# Patient Record
Sex: Female | Born: 1960 | Race: Black or African American | Hispanic: No | Marital: Married | Smoking: Never smoker
Health system: Southern US, Community
[De-identification: ages and names within clinical notes are randomized; demographics above are authoritative.]

## PROBLEM LIST (undated history)

## (undated) DIAGNOSIS — G43909 Migraine, unspecified, not intractable, without status migrainosus: Secondary | ICD-10-CM

## (undated) DIAGNOSIS — T7840XA Allergy, unspecified, initial encounter: Secondary | ICD-10-CM

## (undated) DIAGNOSIS — F32A Depression, unspecified: Secondary | ICD-10-CM

## (undated) DIAGNOSIS — F329 Major depressive disorder, single episode, unspecified: Secondary | ICD-10-CM

## (undated) HISTORY — PX: BREAST BIOPSY: SHX20

## (undated) HISTORY — DX: Allergy, unspecified, initial encounter: T78.40XA

## (undated) HISTORY — DX: Depression, unspecified: F32.A

## (undated) HISTORY — DX: Migraine, unspecified, not intractable, without status migrainosus: G43.909

---
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
## 1998-03-15 ENCOUNTER — Ambulatory Visit (HOSPITAL_COMMUNITY): Admission: RE | Admit: 1998-03-15 | Discharge: 1998-03-15 | Payer: Self-pay | Admitting: Obstetrics

## 2001-01-24 ENCOUNTER — Ambulatory Visit (HOSPITAL_COMMUNITY): Admission: RE | Admit: 2001-01-24 | Discharge: 2001-01-24 | Payer: Self-pay | Admitting: *Deleted

## 2005-06-07 ENCOUNTER — Other Ambulatory Visit: Admission: RE | Admit: 2005-06-07 | Discharge: 2005-06-07 | Payer: Self-pay | Admitting: Family Medicine

## 2006-11-06 HISTORY — PX: STOMACH SURGERY: SHX791

## 2007-03-15 ENCOUNTER — Ambulatory Visit (HOSPITAL_COMMUNITY): Admission: RE | Admit: 2007-03-15 | Discharge: 2007-03-15 | Payer: Self-pay | Admitting: Family Medicine

## 2007-06-09 ENCOUNTER — Inpatient Hospital Stay (HOSPITAL_COMMUNITY): Admission: AD | Admit: 2007-06-09 | Discharge: 2007-06-10 | Payer: Self-pay | Admitting: Obstetrics and Gynecology

## 2007-07-16 ENCOUNTER — Inpatient Hospital Stay (HOSPITAL_COMMUNITY): Admission: AD | Admit: 2007-07-16 | Discharge: 2007-07-18 | Payer: Self-pay | Admitting: Obstetrics and Gynecology

## 2007-07-17 ENCOUNTER — Encounter (INDEPENDENT_AMBULATORY_CARE_PROVIDER_SITE_OTHER): Payer: Self-pay | Admitting: Obstetrics and Gynecology

## 2007-11-19 ENCOUNTER — Ambulatory Visit (HOSPITAL_COMMUNITY): Admission: RE | Admit: 2007-11-19 | Discharge: 2007-11-19 | Payer: Self-pay | Admitting: Obstetrics and Gynecology

## 2007-11-27 ENCOUNTER — Ambulatory Visit (HOSPITAL_COMMUNITY): Admission: RE | Admit: 2007-11-27 | Discharge: 2007-11-27 | Payer: Self-pay | Admitting: Obstetrics and Gynecology

## 2007-12-26 ENCOUNTER — Encounter (INDEPENDENT_AMBULATORY_CARE_PROVIDER_SITE_OTHER): Payer: Self-pay | Admitting: Obstetrics and Gynecology

## 2007-12-26 ENCOUNTER — Ambulatory Visit (HOSPITAL_COMMUNITY): Admission: RE | Admit: 2007-12-26 | Discharge: 2007-12-28 | Payer: Self-pay | Admitting: Obstetrics and Gynecology

## 2008-09-18 ENCOUNTER — Ambulatory Visit (HOSPITAL_COMMUNITY): Admission: RE | Admit: 2008-09-18 | Discharge: 2008-09-18 | Payer: Self-pay | Admitting: Family Medicine

## 2008-11-06 HISTORY — PX: COLOSTOMY: SHX63

## 2009-05-04 ENCOUNTER — Encounter: Payer: Self-pay | Admitting: Internal Medicine

## 2009-05-14 ENCOUNTER — Ambulatory Visit: Payer: Self-pay | Admitting: Internal Medicine

## 2009-05-21 ENCOUNTER — Ambulatory Visit: Payer: Self-pay | Admitting: Internal Medicine

## 2009-05-21 ENCOUNTER — Encounter: Payer: Self-pay | Admitting: Internal Medicine

## 2009-05-25 ENCOUNTER — Encounter: Payer: Self-pay | Admitting: Internal Medicine

## 2009-11-09 ENCOUNTER — Ambulatory Visit (HOSPITAL_COMMUNITY): Admission: RE | Admit: 2009-11-09 | Discharge: 2009-11-09 | Payer: Self-pay | Admitting: Family Medicine

## 2010-11-27 ENCOUNTER — Encounter: Payer: Self-pay | Admitting: Family Medicine

## 2011-01-02 ENCOUNTER — Other Ambulatory Visit (HOSPITAL_COMMUNITY): Payer: Self-pay | Admitting: Family Medicine

## 2011-01-02 DIAGNOSIS — Z1231 Encounter for screening mammogram for malignant neoplasm of breast: Secondary | ICD-10-CM

## 2011-01-05 ENCOUNTER — Ambulatory Visit (HOSPITAL_COMMUNITY): Payer: BC Managed Care – PPO

## 2011-01-17 ENCOUNTER — Other Ambulatory Visit (HOSPITAL_COMMUNITY): Payer: Self-pay | Admitting: Family Medicine

## 2011-01-17 DIAGNOSIS — Z1231 Encounter for screening mammogram for malignant neoplasm of breast: Secondary | ICD-10-CM

## 2011-01-26 ENCOUNTER — Ambulatory Visit (HOSPITAL_COMMUNITY)
Admission: RE | Admit: 2011-01-26 | Discharge: 2011-01-26 | Disposition: A | Discharge status: Home / Self Care | Payer: BC Managed Care – PPO | Source: Ambulatory Visit | Attending: Family Medicine | Admitting: Family Medicine

## 2011-01-26 DIAGNOSIS — Z1231 Encounter for screening mammogram for malignant neoplasm of breast: Secondary | ICD-10-CM | POA: Insufficient documentation

## 2011-03-21 NOTE — H&P (Signed)
NAME:  Sarah Booth, Sarah Booth             ACCOUNT NO.:  [id number]   MEDICAL RECORD NO.:  [id number]         PATIENT TYPE:  WOIB   LOCATION:                                FACILITY:  [facility]   PHYSICIAN:  Malachi Pro. Ambrose Mantle, M.D. DATE OF BIRTH:  06-22-1961   DATE OF ADMISSION:  12/26/2007  DATE OF DISCHARGE:                              HISTORY & PHYSICAL   HISTORY OF PRESENT ILLNESS:  A 50 year old black married female, para 5-  0-1-5 who is admitted to the hospital for surgical removal of a large  pelvic mass.  This patient had a vaginal delivery on July 16, 2007.  When she returned for her 6 weeks checkup on August 27, 2007, there  seemed to be a large pelvic mass the size of a 12 week uterus.  I  thought it might be a sub involuted uterus.  She was advised to return  in 1 month to see if the mass had shrunk and on October 08, 2007, the  mass was the same.  She had had no sex since delivery.  She underwent an  ultrasound which showed no pelvic mass.  I reexamined her on November 05, 2007, and again found the mass.  I had her rescanned at our office.  Again, there was no pelvic mass but when she was scanned at the hospital  there was a 10.4 cm mixed echogenicity solid mass anterior to the  uterine fundus.  This was thought to most likely represent a  pedunculated fibroid, however, a mass of ovarian origin could not be  excluded.  An MRI was recommended.  The MRI showed a 9.3 cm right  ovarian mass most consistent with a dermoid.  The patient was advised  surgery.  She did not want a hysterectomy so we are going to do surgery  to remove the mass.   PAST MEDICAL HISTORY:  Previous operations none.  No serious illnesses.   REVIEW OF SYSTEMS:  No headache.  No heart problems.  No bowel or visual  problems.   SOCIAL HISTORY:  No alcohol or tobacco.  She is a Technical sales engineer.   FAMILY HISTORY:  Father died at 44 with a heart attack.  Mother died at  38 of a heart attack.  Siblings, a  52 year old female with lung problems,  a 52 year old female with diabetes, a 52 year old female died of an  aneurysm, a 52 year old female died with diabetes, a 52 year old female died  because of a murder and a 52 year old female is healthy.  There is a 95-  year-old sister with high blood pressure, a 89 year old sister with high  blood pressure and a 50 year old sister with diabetes, high blood  pressure and a kidney transplant.  There is a 57 year old sister with  high blood pressure and diabetes, a 52 year old healthy female.  She has  a 52 year old female child with Down's syndrome, a 40 year old healthy  female, a 52 year old healthy female, a 52 year old healthy female and a 62-  month-old healthy female.   MEDICATIONS:  She is on prenatal vitamins and one other medication.   ALLERGIES:  She has no  known drug allergies.   PHYSICAL EXAM:  VITAL SIGNS:  On admission blood pressure 134/80, pulse  80, weight is 150 pounds.  She has not had a period in 3 years.  HEENT:  Show no cranial abnormalities.  Extraocular movements are  intact.  Nose and pharynx are clear.  NECK:  Neck is supple without thyromegaly.  HEART:  Normal size and sounds.  No murmurs.  LUNGS:  Lungs are clear to auscultation.  ABDOMEN:  The abdomen is soft.  There is a mass palpable in the lower  abdomen approximately 7 cm above the pubis.  PELVIC:  Exam reveals the vulva and vagina to be clean.  The cervix is  clean.  The uterus is hard to feel.  It seems to be posterior to an  anterior cul-de-sac mass.  It is 8-10 cm in diameter.   ADMITTING IMPRESSION:  Pelvic mass thought to be secondary to a dermoid  cyst of the right ovary.  The patient is admitted most likely for right  salpingo-oophorectomy and thorough inspection of the other ovary.  She  has been counseled about the risks of surgery including but not limited  to a heart attack, stroke, pulmonary embolus, wound disruption,  hemorrhage with need for reoperation and/or  transfusion, fistula  formation, nerve injury, intestinal obstruction.  She understands and  agrees to proceed.      Malachi Pro. Ambrose Mantle, M.D.  Electronically Signed     TFH/MEDQ  D:  12/25/2007  T:  12/25/2007  Job:  [id number]

## 2011-03-21 NOTE — Discharge Summary (Signed)
Sarah Booth, BOSSI             ACCOUNT NO.:  [id number]   MEDICAL RECORD NO.:  [id number]          PATIENT TYPE:  INP   LOCATION:  9102                          [facility]ACILITY:  [facility]   PHYSICIAN:  Malachi Pro. Ambrose Mantle, M.D. DATE OF BIRTH:  [birth year]   DATE OF ADMISSION:  07/16/2007  DATE OF DISCHARGE:  07/18/2007                               DISCHARGE SUMMARY   A 50 year old married black female, para 4-0-1-4, gravida 68, EDC  August 01, 2007, admitted with premature rupture of membranes since  approximately 10:00 p.m. on July 15, 2007.  Blood group and type B+.  Negative antibody.  Sickle cell negative.  RPR nonreactive.  Rubella  immune.  Hepatitis B surface antigen negative.  HIV negative.  GC and  Chlamydia negative.  One-hour Glucola 118 and also 122.  Group B strep  was positive.  The patient had an ultrasound at [facility] of  [place] on Mar 15, 2007 after gestational age was 20 weeks and 1 day.  [facility] August 01, 2007.  Prenatal care was normal except on July 01, 2007, size was thought to be less than dates.  Ultrasound showed an  average of gestational age infant at 66.3 percentile and normal amniotic  fluid volume at 9.81 cm.  The patient began leaking fluid at 10:00 p.m.  at July 15, 2007 at approximately 2:00 a.m.  On July 16, 2007,  she had a gush of fluid.  She came to maternity admission and was  evaluated and found to have ruptured membranes and was admitted.  She  was observed for several hours without a labor pattern, so Pitocin was  begun.   PAST MEDICAL HISTORY:  No known allergies.  No operations.  History of  migraine headaches.   ALCOHOL, TOBACCO, DRUGS:  None.   FAMILY HISTORY:  Mother with high blood pressure, diabetes, and heart  disease.  Brother with high blood pressure and diabetes, heart disease  and COPD.  Sister with high blood pressure, lupus, and had a kidney  transplant.   OBSTETRICAL HISTORY:  The patient delivered a 5  pounds, 4 ounces female at  44 weeks with Downs syndrome in July, 1977.  In November, 1980, she had  a 6 pounds, 5 ounces female at 40 weeks without complications.  In  October, 1984, a 7 pounds, 5 ounces female with no complications.  In  June, 1991, a 7 pounds, 5 ounces female at 40 weeks without  complications.  In 1978, she had an early abortion.   PHYSICAL EXAMINATION:  On admission, her vital signs were normal.  HEART:  Normal.  LUNGS:  Normal.  Fundal height had been 32 cm at July 09, 2007.  Good reactivity of  the fetal heart rates.  There were mild decelerations with some  contractions.  By the nurse exam, the cervix was 1 cm, 50% vertex, and a  -1.   Patient was placed on Pitocin and penicillin.  At 10:52 a.m., I was  called to see the patient because of late decelerations.  After  reviewing the strip, I saw some mild late-onset variable decelerations  but no late decelerations.  There was excellent variability.  At 12:41  p.m., the cervix was 3-4 cm, 80%.  Patient requested an epidural.  After  her epidural, she made rapid progress to full dilatation.  She pushed  well and delivered spontaneously LOP over a second-degree midline  laceration by Dr. Ambrose Mantle.  A living female infant, 5 pounds, 6 ounces with  Apgars of 8 at one and 9 at five minutes.  The placenta was intact.  The  uterus was normal.  A second-degree midline laceration repair with 3-0  Vicryl.  Blood loss was about 400 cc.  Post-partum, the patient did well  and was discharged on the second post partum day.  Initial hemoglobin  12, hematocrit 34.3, white count 5800, platelet count 186,000.  RPR was  nonreactive.  Follow-up hemoglobin 10.8.   FINAL DIAGNOSES:  Intrauterine pregnancy at 37+ weeks, delivered left  occipitoposterior.  Advanced maternal age, positive Group B strep.   OPERATION:  Spontaneous delivery, LOP, repair of second-degree midline  laceration.   FINAL CONDITION:  Improved.   INSTRUCTIONS:   Include our regular discharge instructions booklet,  Percocet 5/325 30 tablets, 1 q.4-6h. as needed for pain.  Patient is  advised to return to the office in six weeks for follow-up examination.      Malachi Pro. Ambrose Mantle, M.D.  Electronically Signed     TFH/MEDQ  D:  07/18/2007  T:  07/18/2007  Job:  [id number]

## 2011-03-21 NOTE — Op Note (Signed)
Sarah Booth, KNOUFF             ACCOUNT NO.:  [id number]   MEDICAL RECORD NO.:  [id number]          PATIENT TYPE:  OIB   LOCATION:  9318                          [facility]ACILITY:  [facility]   PHYSICIAN:  Malachi Pro. Ambrose Mantle, M.D. DATE OF BIRTH:  1961/02/18   DATE OF PROCEDURE:  12/26/2007  DATE OF DISCHARGE:                               OPERATIVE REPORT   PREOPERATIVE DIAGNOSIS:  Pelvic mass, presumably a dermoid in the right  ovary, approximately 10 cm.   POSTOPERATIVE DIAGNOSIS:  A 10-cm dermoid in the right ovary, 2-cm  dermoid in the left ovary.   OPERATION:  Laparotomy, right oophorectomy, partial right salpingectomy,  left oophorocystectomy.   OPERATOR:  Malachi Pro. Ambrose Mantle, M.D.   ASSISTANT:  Zenaida Niece, M.D.   General anesthesia.   The patient was brought to the operating room, placed under satisfactory  general anesthesia.  She did vomit some in the operating room, so she  was given antacid before the intubation.  After she was intubated, she  was placed in frog-leg position.  The abdomen and urethra were prepped  with Betadine solution.  The bladder was drained with a Foley catheter  and hooked to drainage.  Exam revealed a pelvic mass a little to the  right of midline approximately 10 cm.  The patient was placed supine.  The abdomen was entered by transverse incision going in layers through  the skin, subcutaneous tissue, and fascia.  Fascia was then separated  from the rectus muscles superiorly and inferiorly.  The rectus muscle  was split in the midline.  The peritoneum was opened vertically.  I did  not explore the upper abdomen.  I did obtain pelvic washings and saved  them.  It was apparent that the patient had a large ovarian tumor.  It  was just underneath the incision.  With a little manipulation, it came  out through the incisional opening.  It took only three or four clamps  to clamp the infundibulopelvic ligament and the mesosalpinx and  mesovarium.  I removed  the distal part of the right tube, but the  proximal part was left.  All suture was 0 Vicryl.  Double ligatures were  placed across the infundibulopelvic ligament.  After I obtained complete  hemostasis, I preserved the 10-cm ovarian mass and sent it for frozen  section, since 25% of dermoids are bilateral.  I inspected the left  ovary and close to the hilum of the left ovary was a 2-cm mass that  appeared to be a dermoid.  I incised over its surface and then dissected  it free without spilling, bovied the base of the left ovarian dermoid in  the ovary and then reapproximated the ovary with interrupted figure-of-  eight sutures of 4-0 Vicryl.  Liberal irrigation confirmed complete  hemostasis.  The uterus itself was normal size.  The remainder of the  left tube and ovary appeared normal.  There was a proximal segment of  right tube left.  The uterus itself was normal, and both cul-de-sacs  were free of any disease.  The appendix was seen and was normal.  All  packs and retractors were removed after Dr. 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  At that point, the washings were  discarded.  The abdominal wall was closed with interrupted sutures of 0  Vicryl including the peritoneum and the rectus muscle, 2  running sutures of 0 Vicryl on the fascia, a running 3-0 Vicryl in the  subcutaneous tissue and staples on the skin.  The patient seemed to  tolerate the procedure well.  Blood loss was no more than 25 mL.  Sponge  and needle counts were correct, and she was returned to recovery in  satisfactory condition.      Malachi Pro. Ambrose Mantle, M.D.  Electronically Signed     TFH/MEDQ  D:  12/26/2007  T:  12/27/2007  Job:  [id number]

## 2011-03-24 NOTE — Discharge Summary (Signed)
NAMEJOCIE, MERONEY             ACCOUNT NO.:  [id number]   MEDICAL RECORD NO.:  [id number]          PATIENT TYPE:  OIB   LOCATION:  9318                          [facility]ILITY:  [facility]   PHYSICIAN:  Zenaida Niece, M.D.DATE OF BIRTH:  [birth year]   DATE OF ADMISSION:  12/26/2007  DATE OF DISCHARGE:  12/28/2007                               DISCHARGE SUMMARY   ADMISSION DIAGNOSIS:  Pelvic mass.   DISCHARGE DIAGNOSIS:  Bilateral ovarian dermoids.   PROCEDURES:  On December 26, 2007, she underwent an exploratory  laparotomy with right oophorectomy with partial right salpingectomy and  a left oophorocystectomy.   HISTORY AND PHYSICAL:  Please see chart for full history and physical.  Briefly, this is a 50 year old black female para 5-0-1-5 who had a  vaginal delivery in September 2008.  At her 6-week check up, she was  felt to have a large pelvic mass.  Initial ultrasounds were unable to  confirm this, but one ultrasound was eventually able to find a 10-cm  mixed echogenicity solid mass anterior to the uterine fundus.  An MRI  was performed which showed a 9.3-cm right ovarian mass most consistent  with a dermoid.  Physical exam significant for a benign abdomen with a  mass palpable in the lower abdomen approximately 7 cm above the pubic  symphysis, and on pelvic exam uterus was difficult to feel and she has  anterior cul-de-sac mass 8-10 cm in diameter.   HOSPITAL COURSE:  The patient was admitted and underwent a laparotomy  which revealed a large right ovarian mass.  There was also a small tumor  in the left ovary.  She underwent the above-mentioned procedure without  problems.  Postoperatively, she had no significant complications.  Preoperative hemoglobin was 13.6 and postoperative was 11.6.  On  postoperative day #2, she was thought to be stable enough for discharge  home.  Her incision was healing well with some serosanguineous  discharge.  She was discharged home at that  point.   DISCHARGE INSTRUCTIONS:  Regular diet.  No strenuous activity.  Follow  up is within the next week for staple removal.   MEDICATIONS:  Percocet #40 one to two p.o. q.4-6 h. p.r.n. pain.   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      Zenaida Niece, M.D.  Electronically Signed     TDM/MEDQ  D:  01/17/2008  T:  01/17/2008  Job:  [id number]

## 2011-07-28 LAB — CBC
HCT: 39.3
Hemoglobin: 13.6
MCHC: 34.6
MCV: 91.9
Platelets: 160
RBC: 3.6 — ABNORMAL LOW
RBC: 4.3
RDW: 12.3
WBC: 7.9

## 2011-07-28 LAB — DIFFERENTIAL
Basophils Absolute: 0
Basophils Relative: 1
Eosinophils Absolute: 0
Monocytes Relative: 9
Neutro Abs: 2.8
Neutrophils Relative %: 57

## 2011-07-28 LAB — COMPREHENSIVE METABOLIC PANEL
Albumin: 3.8
Alkaline Phosphatase: 96
BUN: 13
Chloride: 106
Creatinine, Ser: 1.07
GFR calc non Af Amer: 55 — ABNORMAL LOW
Glucose, Bld: 101 — ABNORMAL HIGH
Potassium: 3.6
Total Bilirubin: 0.9

## 2011-07-28 LAB — PREGNANCY, URINE

## 2011-08-18 LAB — CBC
HCT: 34.3 — ABNORMAL LOW
HCT: 36.1
Hemoglobin: 12
Hemoglobin: 12.3
MCHC: 34.6
MCHC: 34.9
MCV: 90.4
MCV: 90.5
Platelets: 205
RBC: 3.46 — ABNORMAL LOW
RBC: 3.84 — ABNORMAL LOW
RBC: 3.99
RDW: 14.2 — ABNORMAL HIGH
RDW: 14.4 — ABNORMAL HIGH
WBC: 5.9

## 2011-08-18 LAB — CCBB MATERNAL DONOR DRAW

## 2011-08-21 LAB — URINALYSIS, ROUTINE W REFLEX MICROSCOPIC
Bilirubin Urine: NEGATIVE
Glucose, UA: NEGATIVE
Ketones, ur: NEGATIVE
Nitrite: NEGATIVE
Protein, ur: NEGATIVE
pH: 6.5

## 2012-07-10 ENCOUNTER — Other Ambulatory Visit (HOSPITAL_COMMUNITY): Payer: Self-pay | Admitting: Obstetrics and Gynecology

## 2012-07-10 DIAGNOSIS — Z1231 Encounter for screening mammogram for malignant neoplasm of breast: Secondary | ICD-10-CM

## 2012-07-31 ENCOUNTER — Ambulatory Visit (HOSPITAL_COMMUNITY)
Admission: RE | Admit: 2012-07-31 | Discharge: 2012-07-31 | Disposition: A | Discharge status: Home / Self Care | Payer: BC Managed Care – PPO | Source: Ambulatory Visit | Attending: Obstetrics and Gynecology | Admitting: Obstetrics and Gynecology

## 2012-07-31 DIAGNOSIS — Z1231 Encounter for screening mammogram for malignant neoplasm of breast: Secondary | ICD-10-CM

## 2013-05-11 ENCOUNTER — Encounter (HOSPITAL_COMMUNITY): Payer: Self-pay | Admitting: Emergency Medicine

## 2013-05-11 ENCOUNTER — Emergency Department (HOSPITAL_COMMUNITY)
Admission: EM | Admit: 2013-05-11 | Discharge: 2013-05-12 | Disposition: A | Discharge status: Home / Self Care | Payer: BC Managed Care – PPO | Attending: Emergency Medicine | Admitting: Emergency Medicine

## 2013-05-11 DIAGNOSIS — R030 Elevated blood-pressure reading, without diagnosis of hypertension: Secondary | ICD-10-CM | POA: Insufficient documentation

## 2013-05-11 DIAGNOSIS — R112 Nausea with vomiting, unspecified: Secondary | ICD-10-CM | POA: Insufficient documentation

## 2013-05-11 DIAGNOSIS — Z79899 Other long term (current) drug therapy: Secondary | ICD-10-CM | POA: Insufficient documentation

## 2013-05-11 DIAGNOSIS — G43909 Migraine, unspecified, not intractable, without status migrainosus: Secondary | ICD-10-CM

## 2013-05-11 DIAGNOSIS — H53149 Visual discomfort, unspecified: Secondary | ICD-10-CM | POA: Insufficient documentation

## 2013-05-11 HISTORY — DX: Migraine, unspecified, not intractable, without status migrainosus: G43.909

## 2013-05-11 NOTE — ED Notes (Signed)
PT. REPORTS MIGRAINE HEADACHE WITH EMESIS ONSET 3 DAYS AGO UNRELIEVED BY PRESCRIPTION IMMITREX. ALERT AND ORIENTED . RESPIRATIONS UNLABORED . AMBULATORY.

## 2013-05-12 MED ORDER — NAPROXEN 500 MG PO TABS: 500.0000 mg | ORAL_TABLET | Freq: Two times a day (BID) | ORAL | Status: DC

## 2013-05-12 MED ORDER — METOCLOPRAMIDE HCL 5 MG/ML IJ SOLN
10.0000 mg | Freq: Once | INTRAMUSCULAR | Status: AC
  Administered 2013-05-12: 10 mg via INTRAVENOUS
  Filled 2013-05-12: qty 2

## 2013-05-12 MED ORDER — SODIUM CHLORIDE 0.9 % IV BOLUS (SEPSIS)
1000.0000 mL | Freq: Once | INTRAVENOUS | Status: AC
  Administered 2013-05-12: 1000 mL via INTRAVENOUS

## 2013-05-12 MED ORDER — DIPHENHYDRAMINE HCL 50 MG/ML IJ SOLN
25.0000 mg | Freq: Once | INTRAMUSCULAR | Status: AC
  Administered 2013-05-12: 25 mg via INTRAVENOUS
  Filled 2013-05-12: qty 1

## 2013-05-12 MED ORDER — KETOROLAC TROMETHAMINE 30 MG/ML IJ SOLN
30.0000 mg | Freq: Once | INTRAMUSCULAR | Status: AC
  Administered 2013-05-12: 30 mg via INTRAVENOUS
  Filled 2013-05-12: qty 1

## 2013-05-12 NOTE — ED Provider Notes (Signed)
History    CSN: [id number] Arrival date & time 05/11/13  2034  First MD Initiated Contact with Patient 05/12/13 0003     Chief Complaint  Patient presents with  . Migraine   (Consider location/radiation/quality/duration/timing/severity/associated sxs/prior Treatment) HPI Comments: 52 year old female with a history of headaches for the last 5 years who presents with a complaint of ongoing headache. She states this is a throbbing feeling, located in the front of the head, seems to wax and wane in intensity, has not responded very well to Imitrex is a medication that she usually uses. She has taken Imitrex today, minimal improvement, associated photophobia, occasional nausea. She has been able to eat and drink today, denies dysuria diarrheas, chest pain, shortness of breath, back pain, blurred vision, nasal congestion, sore throat. She has no sinus drainage or tenderness, no stiff neck, no fever  Patient is a 52 y.o. female presenting with migraines. The history is provided by the patient.  Migraine   Past Medical History  Diagnosis Date  . Migraine headache    History reviewed. No pertinent past surgical history. No family history on file. History  Substance Use Topics  . Smoking status: Never Smoker   . Smokeless tobacco: Not on file  . Alcohol Use: No   OB History   Grav Para Term Preterm Abortions TAB SAB Ect Mult Living                 Review of Systems  All other systems reviewed and are negative.    Allergies  Review of patient's allergies indicates no known allergies.  Home Medications   Current Outpatient Rx  Name  Route  Sig  Dispense  Refill  . ALPRAZolam (XANAX) 0.25 MG tablet   Oral   Take 0.25 mg by mouth 3 (three) times daily as needed for anxiety.         [name] Kitchen atorvastatin (LIPITOR) 10 MG tablet   Oral   Take 10 mg by mouth 3 (three) times a week. Monday, Wednesday, and Fridays         . calcium-vitamin D (OSCAL WITH D) 500-200 MG-UNIT per  tablet   Oral   Take 1 tablet by mouth daily.         . Cholecalciferol (VITAMIN D) 2000 UNITS CAPS   Oral   Take 1 capsule by mouth daily.         [name] Kitchen FLUoxetine (PROZAC) 20 MG capsule   Oral   Take 20 mg by mouth See admin instructions. Takes for 14 days only. Takes one week prior to menstrual cycle and week of.         . Multiple Vitamin (MULTIVITAMIN WITH MINERALS) TABS   Oral   Take 1 tablet by mouth daily.         . naproxen (NAPROSYN) 500 MG tablet   Oral   Take 1 tablet (500 mg total) by mouth 2 (two) times daily with a meal.   30 tablet   0    BP 162/91  Pulse 62  Temp(Src) 98.2 F (36.8 C) (Oral)  Resp 18  SpO2 98%  LMP 05/04/2013 Physical Exam  Nursing note and vitals reviewed. Constitutional: She appears well-developed and well-nourished. No distress.  HENT:  Head: Normocephalic and atraumatic.  Mouth/Throat: Oropharynx is clear and moist. No oropharyngeal exudate.  No sinus tenderness or drainage, nasal passages are clear, normal minutes, moist mucous membranes  Eyes: Conjunctivae and EOM are normal. Pupils are equal, round, and reactive to  light. Right eye exhibits no discharge. Left eye exhibits no discharge. No scleral icterus.  Neck: Normal range of motion. Neck supple. No JVD present. No thyromegaly present.  Cardiovascular: Normal rate, regular rhythm, normal heart sounds and intact distal pulses.  Exam reveals no gallop and no friction rub.   No murmur heard. Pulmonary/Chest: Effort normal and breath sounds normal. No respiratory distress. She has no wheezes. She has no rales.  Abdominal: Soft. Bowel sounds are normal. She exhibits no distension and no mass. There is no tenderness.  Musculoskeletal: Normal range of motion. She exhibits no edema and no tenderness.  Lymphadenopathy:    She has no cervical adenopathy.  Neurological: She is alert. Coordination normal.  Speech is clear, cranial nerves III through XII are intact, memory is intact,  strength is normal in all 4 extremities including grips, sensation is intact to light touch and pinprick in all 4 extremities. Coordination as tested by finger-nose-finger is normal, no limb ataxia. Normal gait, normal reflexes at the patellar tendons bilaterally  Skin: Skin is warm and dry. No rash noted. No erythema.  Psychiatric: She has a normal mood and affect. Her behavior is normal.    ED Course  Procedures (including critical care time) Labs Reviewed - No data to display No results found. 1. Migraine     MDM  The patient is well-appearing, her vital signs are significant only for mild hypertension, she has no signs or symptoms of pathologic headache, likely migrainous, medications ordered.  Headache has completely resolved after medications, patient stable for discharge.   Meds given in ED:  Medications  diphenhydrAMINE (BENADRYL) injection 25 mg (25 mg Intravenous Given 05/12/13 0028)  ketorolac (TORADOL) 30 MG/ML injection 30 mg (30 mg Intravenous Given 05/12/13 0028)  metoCLOPramide (REGLAN) injection 10 mg (10 mg Intravenous Given 05/12/13 0028)  sodium chloride 0.9 % bolus 1,000 mL (0 mLs Intravenous Stopped 05/12/13 0200)    New Prescriptions   NAPROXEN (NAPROSYN) 500 MG TABLET    Take 1 tablet (500 mg total) by mouth 2 (two) times daily with a meal.      Vida Roller, MD 05/12/13 [phone]

## 2013-07-30 ENCOUNTER — Encounter (HOSPITAL_COMMUNITY): Payer: Self-pay | Admitting: *Deleted

## 2013-07-30 ENCOUNTER — Emergency Department (HOSPITAL_COMMUNITY): Payer: BC Managed Care – PPO

## 2013-07-30 ENCOUNTER — Emergency Department (HOSPITAL_COMMUNITY)
Admission: EM | Admit: 2013-07-30 | Discharge: 2013-07-30 | Disposition: A | Discharge status: Home / Self Care | Payer: BC Managed Care – PPO | Attending: Emergency Medicine | Admitting: Emergency Medicine

## 2013-07-30 DIAGNOSIS — H81399 Other peripheral vertigo, unspecified ear: Secondary | ICD-10-CM

## 2013-07-30 DIAGNOSIS — R110 Nausea: Secondary | ICD-10-CM | POA: Insufficient documentation

## 2013-07-30 DIAGNOSIS — R55 Syncope and collapse: Secondary | ICD-10-CM | POA: Insufficient documentation

## 2013-07-30 DIAGNOSIS — R42 Dizziness and giddiness: Secondary | ICD-10-CM | POA: Insufficient documentation

## 2013-07-30 DIAGNOSIS — Z79899 Other long term (current) drug therapy: Secondary | ICD-10-CM | POA: Insufficient documentation

## 2013-07-30 DIAGNOSIS — Z8679 Personal history of other diseases of the circulatory system: Secondary | ICD-10-CM | POA: Insufficient documentation

## 2013-07-30 LAB — COMPREHENSIVE METABOLIC PANEL
ALT: 29 U/L (ref 0–35)
AST: 28 U/L (ref 0–37)
Albumin: 4.1 g/dL (ref 3.5–5.2)
BUN: 9 mg/dL (ref 6–23)
CO2: 24 mEq/L (ref 19–32)
Calcium: 9.3 mg/dL (ref 8.4–10.5)
Chloride: 101 mEq/L (ref 96–112)
Creatinine, Ser: 1.09 mg/dL (ref 0.50–1.10)
GFR calc non Af Amer: 56 mL/min — ABNORMAL LOW (ref >90)
Potassium: 3.8 mEq/L (ref 3.5–5.1)
Sodium: 138 mEq/L (ref 135–145)
Total Bilirubin: 0.8 mg/dL (ref 0.3–1.2)
Total Protein: 7.8 g/dL (ref 6.0–8.3)

## 2013-07-30 LAB — CBC WITH DIFFERENTIAL/PLATELET
Basophils Absolute: 0 10*3/uL (ref 0.0–0.1)
Basophils Relative: 0 % (ref 0–1)
Eosinophils Relative: 0 % (ref 0–5)
HCT: 39.5 % (ref 36.0–46.0)
Hemoglobin: 13.4 g/dL (ref 12.0–15.0)
MCHC: 33.9 g/dL (ref 30.0–36.0)
MCV: 87.2 fL (ref 78.0–100.0)
Monocytes Absolute: 0.4 10*3/uL (ref 0.1–1.0)
Monocytes Relative: 13 % — ABNORMAL HIGH (ref 3–12)
Neutro Abs: 1.9 10*3/uL (ref 1.7–7.7)
RBC: 4.53 MIL/uL (ref 3.87–5.11)
RDW: 13.6 % (ref 11.5–15.5)

## 2013-07-30 LAB — URINALYSIS, ROUTINE W REFLEX MICROSCOPIC
Bilirubin Urine: NEGATIVE
Glucose, UA: NEGATIVE mg/dL
Ketones, ur: 15 mg/dL — AB
Nitrite: NEGATIVE
Urobilinogen, UA: 0.2 mg/dL (ref 0.0–1.0)
pH: 7 (ref 5.0–8.0)

## 2013-07-30 LAB — PROTIME-INR
INR: 0.92 (ref 0.00–1.49)
Prothrombin Time: 12.2 seconds (ref 11.6–15.2)

## 2013-07-30 MED ORDER — MECLIZINE HCL 25 MG PO TABS
25.0000 mg | ORAL_TABLET | Freq: Once | ORAL | Status: AC
  Administered 2013-07-30: 25 mg via ORAL
  Filled 2013-07-30: qty 1

## 2013-07-30 MED ORDER — MECLIZINE HCL 25 MG PO TABS: 25.0000 mg | ORAL_TABLET | Freq: Three times a day (TID) | ORAL | Status: DC | PRN

## 2013-07-30 MED ORDER — SODIUM CHLORIDE 0.9 % IV BOLUS (SEPSIS)
1000.0000 mL | Freq: Once | INTRAVENOUS | Status: AC
  Administered 2013-07-30: 1000 mL via INTRAVENOUS

## 2013-07-30 MED ORDER — ONDANSETRON HCL 4 MG/2ML IJ SOLN
4.0000 mg | Freq: Once | INTRAMUSCULAR | Status: AC
  Administered 2013-07-30: 4 mg via INTRAVENOUS
  Filled 2013-07-30: qty 2

## 2013-07-30 MED ORDER — ONDANSETRON 4 MG PO TBDP: ORAL_TABLET | ORAL | Status: DC

## 2013-07-30 NOTE — ED Provider Notes (Signed)
CSN: [id number]     Arrival date & time 07/30/13  0702 History   First MD Initiated Contact with Patient 07/30/13 [phone]     Chief Complaint  Patient presents with  . Loss of Consciousness  . Dizziness   (Consider location/radiation/quality/duration/timing/severity/associated sxs/prior Treatment) Patient is a 52 y.o. female presenting with syncope.  Loss of Consciousness Associated symptoms: dizziness and nausea   Associated symptoms: no chest pain, no fever, no headaches, no shortness of breath, no vomiting and no weakness    Patient in her normal state of health yesterday states she woke up this morning at 5 AM. And when she got out of bed she has sensation of the room spinning, nausea and difficulty walking. Patient states she felt lack of coordination. She lowered herself to the floor. She denies any head or neck trauma. She believes she may have lost consciousness briefly but is unsure. She scooted herself to her daughter's bedroom and then called EMS. Patient still complains of room spinning sensation is worse when she sits up or turns her head. She's never had these sensations before. She denies any focal weakness or numbness. She denies vision changes. Denies recent URI with viral infections. Denies any tinnitus or ear pain. Patient says she does have a history of stroke and cardiac disease in her family. Past Medical History  Diagnosis Date  . Migraine headache    History reviewed. No pertinent past surgical history. No family history on file. History  Substance Use Topics  . Smoking status: Never Smoker   . Smokeless tobacco: Not on file  . Alcohol Use: No   OB History   Grav Para Term Preterm Abortions TAB SAB Ect Mult Living                 Review of Systems  Constitutional: Negative for fever and chills.  HENT: Negative for hearing loss, ear pain, congestion, rhinorrhea, neck pain, neck stiffness and tinnitus.   Eyes: Negative for visual disturbance.  Respiratory:  Negative for chest tightness, shortness of breath and wheezing.   Cardiovascular: Positive for syncope. Negative for chest pain.  Gastrointestinal: Positive for nausea. Negative for vomiting, abdominal pain and diarrhea.  Genitourinary: Negative for dysuria.  Musculoskeletal: Negative for myalgias and back pain.  Skin: Negative for rash and wound.  Neurological: Positive for dizziness and syncope. Negative for weakness, light-headedness, numbness and headaches.    Allergies  Review of patient's allergies indicates no known allergies.  Home Medications   Current Outpatient Rx  Name  Route  Sig  Dispense  Refill  . ALPRAZolam (XANAX) 0.25 MG tablet   Oral   Take 0.25 mg by mouth 3 (three) times daily as needed for anxiety.         [name] Kitchen atorvastatin (LIPITOR) 10 MG tablet   Oral   Take 10 mg by mouth 3 (three) times a week. Monday, Wednesday, and Fridays         . calcium-vitamin D (OSCAL WITH D) 500-200 MG-UNIT per tablet   Oral   Take 1 tablet by mouth daily.         . Cholecalciferol (VITAMIN D) 2000 UNITS CAPS   Oral   Take 1 capsule by mouth daily.         [name] Kitchen FLUoxetine (PROZAC) 20 MG capsule   Oral   Take 20 mg by mouth daily.          . Multiple Vitamin (MULTIVITAMIN WITH MINERALS) TABS   Oral   Take  1 tablet by mouth daily.         . naproxen (NAPROSYN) 500 MG tablet   Oral   Take 1 tablet (500 mg total) by mouth 2 (two) times daily with a meal.   30 tablet   0   . thiamine (VITAMIN B-1) 100 MG tablet   Oral   Take 100 mg by mouth daily.         . meclizine (ANTIVERT) 25 MG tablet   Oral   Take 1 tablet (25 mg total) by mouth 3 (three) times daily as needed for dizziness.   30 tablet   0   . ondansetron (ZOFRAN ODT) 4 MG disintegrating tablet      4mg  ODT q4 hours prn nausea/vomit   8 tablet   0    BP 144/63  Pulse 70  Temp(Src) 98.1 F (36.7 C) (Oral)  Resp 20  SpO2 100% Physical Exam  Nursing note and vitals  reviewed. Constitutional: She is oriented to person, place, and time. She appears well-developed and well-nourished. No distress.  HENT:  Head: Normocephalic and atraumatic.  Right Ear: External ear normal.  Left Ear: External ear normal.  Mouth/Throat: Oropharynx is clear and moist.  Eyes: EOM are normal. Pupils are equal, round, and reactive to light.  Fatigable horizontal nystagmus.  Neck: Normal range of motion. Neck supple.   No bruits appreciated. No midline cervical tenderness.  Cardiovascular: Normal rate and regular rhythm.   Pulmonary/Chest: Effort normal and breath sounds normal. No respiratory distress. She has no wheezes. She has no rales.  Abdominal: Soft. Bowel sounds are normal. She exhibits no distension and no mass. There is no tenderness. There is no rebound and no guarding.  Musculoskeletal: Normal range of motion. She exhibits no edema and no tenderness.  Neurological: She is alert and oriented to person, place, and time.  Patient is alert and oriented x3 with clear, goal oriented speech. Patient has 5/5 motor in all extremities. Sensation is intact to light touch. Bilateral finger-to-nose is normal with no signs of dysmetria. Patient is unable to stand due to her dizziness.   Skin: Skin is warm and dry. No rash noted. No erythema.  Psychiatric: She has a normal mood and affect. Her behavior is normal.    ED Course  Procedures (including critical care time) Labs Review Labs Reviewed  CBC WITH DIFFERENTIAL - Abnormal; Notable for the following:    WBC 2.9 (*)    Lymphs Abs 0.6 (*)    Monocytes Relative 13 (*)    All other components within normal limits  COMPREHENSIVE METABOLIC PANEL - Abnormal; Notable for the following:    Glucose, Bld 110 (*)    GFR calc non Af Amer 56 (*)    GFR calc Af Amer 65 (*)    All other components within normal limits  URINALYSIS, ROUTINE W REFLEX MICROSCOPIC - Abnormal; Notable for the following:    APPearance CLOUDY (*)     Ketones, ur 15 (*)    Leukocytes, UA TRACE (*)    All other components within normal limits  URINE MICROSCOPIC-ADD ON - Abnormal; Notable for the following:    Squamous Epithelial / LPF MANY (*)    All other components within normal limits  PROTIME-INR  TROPONIN I  APTT   Imaging Review Mr Brain Wo Contrast  07/30/2013   *RADIOLOGY REPORT*  Clinical Data: Vertigo with lack of coordination.  Dizziness.  MRI HEAD WITHOUT CONTRAST  Technique:  Multiplanar, multiecho pulse sequences  of the brain and surrounding structures were obtained according to standard protocol without intravenous contrast.  Comparison: No prior cross-sectional imaging studies of the brain.  Findings: There is no evidence for acute infarction, intracranial hemorrhage, mass lesion, hydrocephalus, or extra-axial fluid. There is minimal nonspecific subcortical and periventricular white matter signal abnormality, likely early chronic microvascular ischemic change.  No large vessel infarct.  Flow voids are maintained in the major intracranial vascular structures.  The pituitary gland is flattened and the sella is mildly expanded consistent with empty sella syndrome.  This can be associated variety of nonspecific complaints.  There is no cerebellar tonsillar herniation.  Upper cervical region unremarkable.  No osseous findings.  Normal orbits.  No sinus or mastoid disease. Tiny intraparotid lymph node on the right appear  IMPRESSION: No acute intracranial findings.  Mild nonspecific white matter signal abnormality, likely early chronic microvascular ischemic change.  Empty sella; significance uncertain.   Original Report Authenticated By: Davonna Belling, M.D.    Date: 07/30/2013  Rate: 85  Rhythm: normal sinus rhythm  QRS Axis: normal  Intervals: normal  ST/T Wave abnormalities: normal  Conduction Disutrbances:none  Narrative Interpretation:   Old EKG Reviewed: none available   MDM  Sensation is feeling much better. She does not  have any symptoms while lying in bed. States she does have some mild dizziness when she tries to get up. MRI with no acute findings.  Will ambulate in the emergency department and discharged home with prescription for meclizine. Patient has also been advised to followup with ENT for persistent symptoms. Return precautions have been given  Patient ambulated to the bathroom without assistance. She did become slightly more dizzy and need a wheelchair. A repeat dose of meclizine and have reiterated need to followup with ENT should symptoms persist  Loren Racer, MD 07/30/13 1123

## 2013-07-30 NOTE — ED Notes (Addendum)
PT ambulated to BR tol Well. Pt unable to ambulate back to room due to vertigo. Pt denies any vision changes.Pt returned to room vis W/C. Pt remaind A/O x4. Event reported to [facility].

## 2013-07-30 NOTE — ED Notes (Signed)
Pt got up dizzy and tried to get to bathroom and she passed out.  Pt states she woke and scooted to daughter's room.  Pt has family history of DM.  CBG 145.  BP 162/93.  No pain anywhere.

## 2013-08-26 ENCOUNTER — Other Ambulatory Visit (HOSPITAL_COMMUNITY): Payer: Self-pay | Admitting: Obstetrics and Gynecology

## 2013-08-26 DIAGNOSIS — Z1231 Encounter for screening mammogram for malignant neoplasm of breast: Secondary | ICD-10-CM

## 2013-09-11 ENCOUNTER — Ambulatory Visit (HOSPITAL_COMMUNITY): Payer: BC Managed Care – PPO

## 2013-09-25 ENCOUNTER — Other Ambulatory Visit (HOSPITAL_COMMUNITY): Payer: Self-pay | Admitting: Obstetrics and Gynecology

## 2013-12-10 ENCOUNTER — Other Ambulatory Visit: Payer: Self-pay | Admitting: Obstetrics and Gynecology

## 2013-12-10 DIAGNOSIS — R928 Other abnormal and inconclusive findings on diagnostic imaging of breast: Secondary | ICD-10-CM

## 2013-12-22 ENCOUNTER — Inpatient Hospital Stay: Admission: RE | Admit: 2013-12-22 | Payer: BC Managed Care – PPO | Source: Ambulatory Visit

## 2013-12-22 ENCOUNTER — Other Ambulatory Visit: Payer: BC Managed Care – PPO

## 2013-12-24 ENCOUNTER — Ambulatory Visit
Admission: RE | Admit: 2013-12-24 | Discharge: 2013-12-24 | Disposition: A | Discharge status: Home / Self Care | Payer: BC Managed Care – PPO | Source: Ambulatory Visit | Attending: Obstetrics and Gynecology | Admitting: Obstetrics and Gynecology

## 2013-12-24 DIAGNOSIS — R928 Other abnormal and inconclusive findings on diagnostic imaging of breast: Secondary | ICD-10-CM

## 2013-12-25 ENCOUNTER — Other Ambulatory Visit: Payer: BC Managed Care – PPO

## 2014-05-27 ENCOUNTER — Other Ambulatory Visit: Payer: Self-pay | Admitting: Obstetrics and Gynecology

## 2014-05-27 DIAGNOSIS — N6310 Unspecified lump in the right breast, unspecified quadrant: Secondary | ICD-10-CM

## 2014-06-24 ENCOUNTER — Ambulatory Visit
Admission: RE | Admit: 2014-06-24 | Discharge: 2014-06-24 | Disposition: A | Discharge status: Home / Self Care | Payer: BC Managed Care – PPO | Source: Ambulatory Visit | Attending: Obstetrics and Gynecology | Admitting: Obstetrics and Gynecology

## 2014-06-24 ENCOUNTER — Encounter (INDEPENDENT_AMBULATORY_CARE_PROVIDER_SITE_OTHER): Payer: Self-pay

## 2014-06-24 DIAGNOSIS — N6310 Unspecified lump in the right breast, unspecified quadrant: Secondary | ICD-10-CM

## 2014-07-26 ENCOUNTER — Ambulatory Visit (INDEPENDENT_AMBULATORY_CARE_PROVIDER_SITE_OTHER): Payer: BC Managed Care – PPO

## 2014-07-26 ENCOUNTER — Ambulatory Visit (INDEPENDENT_AMBULATORY_CARE_PROVIDER_SITE_OTHER): Payer: BC Managed Care – PPO | Admitting: Family Medicine

## 2014-07-26 VITALS — BP 124/76 | HR 74 | Temp 98.0°F | Resp 20 | Ht 64.25 in | Wt 164.8 lb

## 2014-07-26 DIAGNOSIS — M25511 Pain in right shoulder: Secondary | ICD-10-CM

## 2014-07-26 DIAGNOSIS — S43429A Sprain of unspecified rotator cuff capsule, initial encounter: Secondary | ICD-10-CM

## 2014-07-26 DIAGNOSIS — M25519 Pain in unspecified shoulder: Secondary | ICD-10-CM

## 2014-07-26 DIAGNOSIS — S43421A Sprain of right rotator cuff capsule, initial encounter: Secondary | ICD-10-CM

## 2014-07-26 MED ORDER — PREDNISONE 20 MG PO TABS: 40.0000 mg | ORAL_TABLET | Freq: Every day | ORAL | Status: DC

## 2014-07-26 NOTE — Progress Notes (Signed)
Patient ID: Sarah Booth MRN: [id number], DOB: Apr 28, 1961, 53 y.o. Date of Encounter: 07/26/2014, 9:51 AM  This chart was scribed for Dr. Robyn Haber, MD by Erling Conte, Medical Scribe. This patient was seen in Room 11 and the patient's care was started at 9:54 AM.  Primary Physician: Chesley Noon, MD  Chief Complaint: Chief Complaint  Patient presents with   Arm Pain    right arm--was pulling dresser drawer last night--pain from shoulder to wrist--right hand feels a little numbness feeling     HPI: 53 y.o. year old female with history below presents with right arm pain for 1 day. Pt states that last night she went to open her dresser drawer and her right arm just went numb and she had pain radiating from her right shoulder to her wrist. She is able to flex her right arm but it hurts to lift it upward. She states that rest seems to help the pain somewhat.  She denies any injury to the area. She denies any neck or back pain.  Patient works as a Physiological scientist   Past Medical History  Diagnosis Date   Migraine headache      Home Meds: Prior to Admission medications   Medication Sig Start Date End Date Taking? Authorizing Provider  calcium-vitamin D (OSCAL WITH D) 500-200 MG-UNIT per tablet Take 1 tablet by mouth daily.   Yes Historical Provider, MD  Cholecalciferol (VITAMIN D) 2000 UNITS CAPS Take 1 capsule by mouth daily.   Yes Historical Provider, MD  FLUoxetine (PROZAC) 20 MG capsule Take 20 mg by mouth daily.    Yes Historical Provider, MD  Multiple Vitamin (MULTIVITAMIN WITH MINERALS) TABS Take 1 tablet by mouth daily.   Yes Historical Provider, MD  naproxen (NAPROSYN) 500 MG tablet Take 1 tablet (500 mg total) by mouth 2 (two) times daily with a meal. 05/12/13  Yes Johnna Acosta, MD  ALPRAZolam Duanne Moron) 0.25 MG tablet Take 0.25 mg by mouth 3 (three) times daily as needed for anxiety.    Historical Provider, MD  atorvastatin (LIPITOR) 10 MG tablet Take  10 mg by mouth 3 (three) times a week. Monday, Wednesday, and Fridays    Historical Provider, MD  meclizine (ANTIVERT) 25 MG tablet Take 1 tablet (25 mg total) by mouth 3 (three) times daily as needed for dizziness. 07/30/13   Julianne Rice, MD  ondansetron (ZOFRAN ODT) 4 MG disintegrating tablet 4mg  ODT q4 hours prn nausea/vomit 07/30/13   Julianne Rice, MD  thiamine (VITAMIN B-1) 100 MG tablet Take 100 mg by mouth daily.    Historical Provider, MD    Allergies: No Known Allergies  History   Social History   Marital Status: Married    Spouse Name: N/A    Number of Children: N/A   Years of Education: N/A   Occupational History   Not on file.   Social History Main Topics   Smoking status: Never Smoker    Smokeless tobacco: Not on file   Alcohol Use: No   Drug Use: No   Sexual Activity: Not on file   Other Topics Concern   Not on file   Social History Narrative   No narrative on file     Review of Systems: Constitutional: negative for chills, fever, night sweats, weight changes, or fatigue  HEENT: negative for vision changes, hearing loss, congestion, rhinorrhea, ST, epistaxis, or sinus pressure Cardiovascular: negative for chest pain or palpitations Respiratory: negative for hemoptysis, wheezing, shortness of breath, or  cough Abdominal: negative for abdominal pain, nausea, vomiting, diarrhea, or constipation Dermatological: negative for rash Neurologic: negative for headache, dizziness, or syncope Musculoskeletal: negative for neck pain, back pain. Positive for right arm pain accompanied by numbness that radiates down to right wrist. All other systems reviewed and are otherwise negative with the exception to those above and in the HPI.   Physical Exam: Blood pressure 124/76, pulse 74, temperature 98 F (36.7 C), temperature source Oral, resp. rate 20, height 5' 4.25" (1.632 m), weight 164 lb 12.8 oz (74.753 kg), last menstrual period 07/10/2014, SpO2 99.00%.,  Body mass index is 28.07 kg/(m^2). General: Well developed, well nourished, in no acute distress. Head: Normocephalic, atraumatic, eyes without discharge, sclera non-icteric, nares are without discharge. Bilateral auditory canals clear, TM's are without perforation, pearly grey and translucent with reflective cone of light bilaterally. Oral cavity moist, posterior pharynx without exudate, erythema, peritonsillar abscess, or post nasal drip.  Neck: Supple. No thyromegaly. Full ROM. No lymphadenopathy. Lungs: Clear bilaterally to auscultation without wheezes, rales, or rhonchi. Breathing is unlabored. Heart: RRR with S1 S2. No murmurs, rubs, or gallops appreciated. Abdomen: Soft, non-tender, non-distended with normoactive bowel sounds. No hepatomegaly. No rebound/guarding. No obvious abdominal masses. Msk:  Strength and tone normal for age. Extremities/Skin: Warm and dry. No clubbing or cyanosis. No edema. No rashes or suspicious lesions. Patient has pain when abducting the arm greater than 20 or raising arm forward greater than 30. She is diffusely tender both anteriorly as well as just below the acromion. Neuro: Alert and oriented X 3. Moves all extremities spontaneously. Gait is normal. CNII-XII grossly in tact. Psych:  Responds to questions appropriately with a normal affect.   [facility] reading (PRIMARY) by  Dr. Joseph Art:  Right shoulder shows calcification c/w rotator cuff tendonosis.   ASSESSMENT AND PLAN:  53 y.o. year old female with  Right shoulder pain - Plan: DG Shoulder Right, predniSONE (DELTASONE) 20 MG tablet, Ambulatory referral to Orthopedic Surgery  Rotator cuff (capsule) sprain, right, initial encounter - Plan: predniSONE (DELTASONE) 20 MG tablet, Ambulatory referral to Orthopedic Surgery  Signed, Robyn Haber, MD     Signed, Robyn Haber, MD 07/26/2014 9:51 AM

## 2014-07-26 NOTE — Patient Instructions (Signed)
Rotator Cuff Injury Rotator cuff injury is any type of injury to the set of muscles and tendons that make up the stabilizing unit of your shoulder. This unit holds the ball of your upper arm bone (humerus) in the socket of your shoulder blade (scapula).  CAUSES Injuries to your rotator cuff most commonly come from sports or activities that cause your arm to be moved repeatedly over your head. Examples of this include throwing, weight lifting, swimming, or racquet sports. Long lasting (chronic) irritation of your rotator cuff can cause soreness and swelling (inflammation), bursitis, and eventual damage to your tendons, such as a tear (rupture). SIGNS AND SYMPTOMS Acute rotator cuff tear:  Sudden tearing sensation followed by severe pain shooting from your upper shoulder down your arm toward your elbow.  Decreased range of motion of your shoulder because of pain and muscle spasm.  Severe pain.  Inability to raise your arm out to the side because of pain and loss of muscle power (large tears). Chronic rotator cuff tear:  Pain that usually is worse at night and may interfere with sleep.  Gradual weakness and decreased shoulder motion as the pain worsens.  Decreased range of motion. Rotator cuff tendinitis:  Deep ache in your shoulder and the outside upper arm over your shoulder.  Pain that comes on gradually and becomes worse when lifting your arm to the side or turning it inward. DIAGNOSIS Rotator cuff injury is diagnosed through a medical history, physical exam, and imaging exam. The medical history helps determine the type of rotator cuff injury. Your health care provider will look at your injured shoulder, feel the injured area, and ask you to move your shoulder in different positions. X-ray exams typically are done to rule out other causes of shoulder pain, such as fractures. MRI is the exam of choice for the most severe shoulder injuries because the images show muscles and tendons.    TREATMENT  Chronic tear:  Medicine for pain, such as acetaminophen or ibuprofen.  Physical therapy and range-of-motion exercises may be helpful in maintaining shoulder function and strength.  Steroid injections into your shoulder joint.  Surgical repair of the rotator cuff if the injury does not heal with noninvasive treatment. Acute tear:  Anti-inflammatory medicines such as ibuprofen and naproxen to help reduce pain and swelling.  A sling to help support your arm and rest your rotator cuff muscles. Long-term use of a sling is not advised. It may cause significant stiffening of the shoulder joint.  Surgery may be considered within a few weeks, especially in younger, active people, to return the shoulder to full function.  Indications for surgical treatment include the following:  Age younger than 60 years.  Rotator cuff tears that are complete.  Physical therapy, rest, and anti-inflammatory medicines have been used for 6-8 weeks, with no improvement.  Employment or sporting activity that requires constant shoulder use. Tendinitis:  Anti-inflammatory medicines such as ibuprofen and naproxen to help reduce pain and swelling.  A sling to help support your arm and rest your rotator cuff muscles. Long-term use of a sling is not advised. It may cause significant stiffening of the shoulder joint.  Severe tendinitis may require:  Steroid injections into your shoulder joint.  Physical therapy.  Surgery. HOME CARE INSTRUCTIONS   Apply ice to your injury:  Put ice in a plastic bag.  Place a towel between your skin and the bag.  Leave the ice on for 20 minutes, 2-3 times a day.  If you   have a shoulder immobilizer (sling and straps), wear it until told otherwise by your health care provider.  You may want to sleep on several pillows or in a recliner at night to lessen swelling and pain.  Only take over-the-counter or prescription medicines for pain, discomfort, or fever as  directed by your health care provider.  Do simple hand squeezing exercises with a soft rubber ball to decrease hand swelling. SEEK MEDICAL CARE IF:   Your shoulder pain increases, or new pain or numbness develops in your arm, hand, or fingers.  Your hand or fingers are colder than your other hand. SEEK IMMEDIATE MEDICAL CARE IF:   Your arm, hand, or fingers are numb or tingling.  Your arm, hand, or fingers are increasingly swollen and painful, or they turn white or blue. MAKE SURE YOU:  Understand these instructions.  Will watch your condition.  Will get help right away if you are not doing well or get worse. Document Released: 10/20/2000 Document Revised: 10/28/2013 Document Reviewed: 06/04/2013 Exit[employer] Patient Information 2015 [employer]. This information is not intended to replace advice given to you by your health care provider. Make sure you discuss any questions you have with your health care provider.  

## 2014-07-27 ENCOUNTER — Telehealth: Payer: Self-pay | Admitting: Family Medicine

## 2014-07-27 NOTE — Telephone Encounter (Signed)
Erroneous encounter

## 2014-07-28 ENCOUNTER — Telehealth: Payer: Self-pay

## 2014-07-28 NOTE — Telephone Encounter (Signed)
Patient needs a copy of x-rays to take to her orthopaedic appointment. Please call when ready for pick up.  [phone]

## 2014-07-28 NOTE — Telephone Encounter (Signed)
Given to xray

## 2015-05-23 ENCOUNTER — Ambulatory Visit (INDEPENDENT_AMBULATORY_CARE_PROVIDER_SITE_OTHER): Payer: BLUE CROSS/BLUE SHIELD | Admitting: Family Medicine

## 2015-05-23 VITALS — BP 130/80 | HR 74 | Temp 98.0°F | Resp 18 | Ht 64.0 in | Wt 174.0 lb

## 2015-05-23 DIAGNOSIS — M65312 Trigger thumb, left thumb: Secondary | ICD-10-CM | POA: Diagnosis not present

## 2015-05-23 NOTE — Patient Instructions (Signed)
Trigger Finger Trigger finger (digital tendinitis and stenosing tenosynovitis) is a common disorder that causes an often painful catching of the fingers or thumb. It occurs as a clicking, snapping, or locking of a finger in the palm of the hand. This is caused by a problem with the tendons that flex or bend the fingers sliding smoothly through their sheaths. The condition may occur in any finger or a couple fingers at the same time.  The finger may lock with the finger curled or suddenly straighten out with a snap. This is more common in patients with rheumatoid arthritis and diabetes. Left untreated, the condition may get worse to the point where the finger becomes locked in flexion, like making a fist, or less commonly locked with the finger straightened out. CAUSES   Inflammation and scarring that lead to swelling around the tendon sheath.  Repeated or forceful movements.  Rheumatoid arthritis, an autoimmune disease that affects joints.  Gout.  Diabetes mellitus. SIGNS AND SYMPTOMS  Soreness and swelling of your finger.  A painful clicking or snapping as you bend and straighten your finger. DIAGNOSIS  Your health care provider will do a physical exam of your finger to diagnose trigger finger. TREATMENT   Splinting for 6-8 weeks may be helpful.  Nonsteroidal anti-inflammatory medicines (NSAIDs) can help to relieve the pain and inflammation.  Cortisone injections, along with splinting, may speed up recovery. Several injections may be required. Cortisone may give relief after one injection.  Surgery is another treatment that may be used if conservative treatments do not work. Surgery can be minor, without incisions (a cut does not have to be made), and can be done with a needle through the skin.  Other surgical choices involve an open procedure in which the surgeon opens the hand through a small incision and cuts the pulley so the tendon can again slide smoothly. Your hand will still  work fine. HOME CARE INSTRUCTIONS  Apply ice to the injured area, twice per day:  Put ice in a plastic bag.  Place a towel between your skin and the bag.  Leave the ice on for 20 minutes, 3-4 times a day.  Rest your hand often. MAKE SURE YOU:   Understand these instructions.  Will watch your condition.  Will get help right away if you are not doing well or get worse. Document Released: 08/12/2004 Document Revised: 06/25/2013 Document Reviewed: 03/25/2013 Exit[employer] Patient Information 2015 [employer]. This information is not intended to replace advice given to you by your health care provider. Make sure you discuss any questions you have with your health care provider.  

## 2015-05-23 NOTE — Progress Notes (Signed)
Patient ID: Sarah Booth, female   DOB: 02-22-1961, 54 y.o.   MRN: [id number]   This chart was scribed for Robyn Haber, MD by [facility], medical scribe at [facility].The patient was seen in exam room 13 and the patient's care was started at 10:19 AM.  Patient ID: Sarah Booth MRN: [id number], DOB: [birth year], 54 y.o. Date of Encounter: 05/23/2015  Primary Physician: Chesley Noon, MD  Chief Complaint:  Chief Complaint  Patient presents with   Hand Pain    C/O left thumb pain x 1 month   HPI:  Sarah Booth is a 54 y.o. female who presents to [facility] complaining of left thumb pain ongoing for one month. Right hand dominant. She is a CMA.  Past Medical History  Diagnosis Date   Migraine headache     Home Meds: Prior to Admission medications   Medication Sig Start Date End Date Taking? Authorizing Provider  atorvastatin (LIPITOR) 10 MG tablet Take 10 mg by mouth 3 (three) times a week. Monday, Wednesday, and Fridays   Yes Historical Provider, MD  calcium-vitamin D (OSCAL WITH D) 500-200 MG-UNIT per tablet Take 1 tablet by mouth daily.   Yes Historical Provider, MD  Cholecalciferol (VITAMIN D) 2000 UNITS CAPS Take 1 capsule by mouth daily.   Yes Historical Provider, MD  FLUoxetine (PROZAC) 20 MG capsule Take 20 mg by mouth daily.    Yes Historical Provider, MD  meclizine (ANTIVERT) 25 MG tablet Take 1 tablet (25 mg total) by mouth 3 (three) times daily as needed for dizziness. 07/30/13  Yes Julianne Rice, MD  Multiple Vitamin (MULTIVITAMIN WITH MINERALS) TABS Take 1 tablet by mouth daily.   Yes Historical Provider, MD  naproxen (NAPROSYN) 500 MG tablet Take 1 tablet (500 mg total) by mouth 2 (two) times daily with a meal. 05/12/13  Yes Noemi Chapel, MD  ALPRAZolam Duanne Moron) 0.25 MG tablet Take 0.25 mg by mouth 3 (three) times daily as needed for anxiety.    Historical Provider, MD  ondansetron (ZOFRAN ODT) 4 MG disintegrating  tablet 4mg  ODT q4 hours prn nausea/vomit Patient not taking: Reported on 05/23/2015 07/30/13   Julianne Rice, MD  predniSONE (DELTASONE) 20 MG tablet Take 2 tablets (40 mg total) by mouth daily. Patient not taking: Reported on 05/23/2015 07/26/14   Robyn Haber, MD  thiamine (VITAMIN B-1) 100 MG tablet Take 100 mg by mouth daily.    Historical Provider, MD    Allergies: No Known Allergies  History   Social History   Marital Status: Married    Spouse Name: N/A   Number of Children: N/A   Years of Education: N/A   Occupational History   Not on file.   Social History Main Topics   Smoking status: Never Smoker    Smokeless tobacco: Not on file   Alcohol Use: No   Drug Use: No   Sexual Activity: Not on file   Other Topics Concern   Not on file   Social History Narrative     Review of Systems: Constitutional: negative for chills, fever, night sweats, weight changes, or fatigue  HEENT: negative for vision changes, hearing loss, congestion, rhinorrhea, ST, epistaxis, or sinus pressure Cardiovascular: negative for chest pain or palpitations Respiratory: negative for hemoptysis, wheezing, shortness of breath, or cough Abdominal: negative for abdominal pain, nausea, vomiting, diarrhea, or constipation Dermatological: negative for rash Msk: left thumb pain. Neurologic: negative for headache, dizziness, or syncope All other systems reviewed and  are otherwise negative with the exception to those above and in the HPI.  Physical Exam: Blood pressure 130/80, pulse 74, temperature 98 F (36.7 C), temperature source Oral, resp. rate 18, height 5\' 4"  (1.626 m), weight 174 lb (78.926 kg), last menstrual period 05/07/2015, SpO2 98 %., Body mass index is 29.85 kg/(m^2). General: Well developed, well nourished, in no acute distress. Head: Normocephalic, atraumatic, eyes without discharge, sclera non-icteric, nares are without discharge. Bilateral auditory canals clear, TM's are  without perforation, pearly grey and translucent with reflective cone of light bilaterally. Oral cavity moist, posterior pharynx without exudate, erythema, peritonsillar abscess, or post nasal drip.  Neck: Supple. No thyromegaly. Full ROM. No lymphadenopathy. Lungs: Clear bilaterally to auscultation without wheezes, rales, or rhonchi. Breathing is unlabored. Heart: RRR with S1 S2. No murmurs, rubs, or gallops appreciated. Abdomen: Soft, non-tender, non-distended with normoactive bowel sounds. No hepatomegaly. No rebound/guarding. No obvious abdominal masses. Msk:  Strength and tone normal for age. Extremities/Skin: Warm and dry. No clubbing or cyanosis. No edema. No rashes or suspicious lesions. Patient demonstrates a trigger finger of the distal phalanx of her left thumb Neuro: Alert and oriented X 3. Moves all extremities spontaneously. Gait is normal. CNII-XII grossly in tact. Psych:  Responds to questions appropriately with a normal affect.     ASSESSMENT AND PLAN:  54 y.o. year old female with  This chart was scribed in my presence and reviewed by me personally.    ICD-9-CM ICD-10-CM   1. Trigger thumb, left 727.03 M65.312 Ambulatory referral to Orthopedic Surgery     Signed, Robyn Haber, MD  Signed, Robyn Haber, MD 05/23/2015 10:19 AM

## 2016-04-25 ENCOUNTER — Other Ambulatory Visit: Payer: Self-pay | Admitting: Obstetrics and Gynecology

## 2016-04-25 DIAGNOSIS — Z1231 Encounter for screening mammogram for malignant neoplasm of breast: Secondary | ICD-10-CM

## 2016-05-10 ENCOUNTER — Ambulatory Visit
Admission: RE | Admit: 2016-05-10 | Discharge: 2016-05-10 | Disposition: A | Discharge status: Home / Self Care | Payer: BLUE CROSS/BLUE SHIELD | Source: Ambulatory Visit | Attending: Obstetrics and Gynecology | Admitting: Obstetrics and Gynecology

## 2016-05-10 ENCOUNTER — Other Ambulatory Visit: Payer: Self-pay | Admitting: Obstetrics and Gynecology

## 2016-05-10 DIAGNOSIS — R928 Other abnormal and inconclusive findings on diagnostic imaging of breast: Secondary | ICD-10-CM

## 2016-05-10 DIAGNOSIS — Z1231 Encounter for screening mammogram for malignant neoplasm of breast: Secondary | ICD-10-CM

## 2016-05-10 DIAGNOSIS — R2231 Localized swelling, mass and lump, right upper limb: Secondary | ICD-10-CM

## 2016-05-15 ENCOUNTER — Ambulatory Visit
Admission: RE | Admit: 2016-05-15 | Discharge: 2016-05-15 | Disposition: A | Discharge status: Home / Self Care | Payer: BLUE CROSS/BLUE SHIELD | Source: Ambulatory Visit | Attending: Obstetrics and Gynecology | Admitting: Obstetrics and Gynecology

## 2016-05-15 ENCOUNTER — Other Ambulatory Visit: Payer: Self-pay | Admitting: Obstetrics and Gynecology

## 2016-05-15 DIAGNOSIS — R2231 Localized swelling, mass and lump, right upper limb: Secondary | ICD-10-CM

## 2017-01-04 ENCOUNTER — Other Ambulatory Visit: Payer: Self-pay | Admitting: Obstetrics and Gynecology

## 2017-01-04 DIAGNOSIS — R2231 Localized swelling, mass and lump, right upper limb: Secondary | ICD-10-CM

## 2017-01-17 ENCOUNTER — Ambulatory Visit
Admission: RE | Admit: 2017-01-17 | Discharge: 2017-01-17 | Disposition: A | Discharge status: Home / Self Care | Payer: BLUE CROSS/BLUE SHIELD | Source: Ambulatory Visit | Attending: Obstetrics and Gynecology | Admitting: Obstetrics and Gynecology

## 2017-01-17 ENCOUNTER — Other Ambulatory Visit: Payer: Self-pay | Admitting: Obstetrics and Gynecology

## 2017-01-17 DIAGNOSIS — R2231 Localized swelling, mass and lump, right upper limb: Secondary | ICD-10-CM

## 2017-05-02 ENCOUNTER — Other Ambulatory Visit: Payer: Self-pay | Admitting: Obstetrics and Gynecology

## 2017-05-02 DIAGNOSIS — IMO0002 Reserved for concepts with insufficient information to code with codable children: Secondary | ICD-10-CM

## 2017-05-02 DIAGNOSIS — R229 Localized swelling, mass and lump, unspecified: Principal | ICD-10-CM

## 2017-05-14 ENCOUNTER — Ambulatory Visit
Admission: RE | Admit: 2017-05-14 | Discharge: 2017-05-14 | Disposition: A | Discharge status: Home / Self Care | Payer: BLUE CROSS/BLUE SHIELD | Source: Ambulatory Visit | Attending: Obstetrics and Gynecology | Admitting: Obstetrics and Gynecology

## 2017-05-14 DIAGNOSIS — R229 Localized swelling, mass and lump, unspecified: Principal | ICD-10-CM

## 2017-05-14 DIAGNOSIS — IMO0002 Reserved for concepts with insufficient information to code with codable children: Secondary | ICD-10-CM

## 2018-06-26 ENCOUNTER — Other Ambulatory Visit: Payer: Self-pay | Admitting: Obstetrics and Gynecology

## 2018-06-26 DIAGNOSIS — Z1231 Encounter for screening mammogram for malignant neoplasm of breast: Secondary | ICD-10-CM

## 2018-07-23 ENCOUNTER — Ambulatory Visit
Admission: RE | Admit: 2018-07-23 | Discharge: 2018-07-23 | Disposition: A | Discharge status: Home / Self Care | Payer: BLUE CROSS/BLUE SHIELD | Source: Ambulatory Visit | Attending: Obstetrics and Gynecology | Admitting: Obstetrics and Gynecology

## 2018-07-23 DIAGNOSIS — Z1231 Encounter for screening mammogram for malignant neoplasm of breast: Secondary | ICD-10-CM

## 2019-04-25 ENCOUNTER — Encounter: Payer: Self-pay | Admitting: Gastroenterology

## 2019-06-13 ENCOUNTER — Other Ambulatory Visit: Payer: Self-pay | Admitting: Obstetrics and Gynecology

## 2019-06-13 DIAGNOSIS — Z1231 Encounter for screening mammogram for malignant neoplasm of breast: Secondary | ICD-10-CM

## 2019-07-24 ENCOUNTER — Telehealth: Payer: Self-pay | Admitting: Radiology

## 2019-07-24 NOTE — Telephone Encounter (Signed)
Patient lmom states that she is having a thumb and wrist problem, can you please schedule her with one of the ortho providers or Dr. Junius Roads? Thank you.

## 2019-07-25 ENCOUNTER — Ambulatory Visit: Payer: Self-pay

## 2019-07-25 ENCOUNTER — Ambulatory Visit (INDEPENDENT_AMBULATORY_CARE_PROVIDER_SITE_OTHER): Payer: PRIVATE HEALTH INSURANCE | Admitting: Family Medicine

## 2019-07-25 ENCOUNTER — Encounter: Payer: Self-pay | Admitting: Family Medicine

## 2019-07-25 DIAGNOSIS — M65311 Trigger thumb, right thumb: Secondary | ICD-10-CM | POA: Diagnosis not present

## 2019-07-25 DIAGNOSIS — R2232 Localized swelling, mass and lump, left upper limb: Secondary | ICD-10-CM

## 2019-07-25 NOTE — Progress Notes (Signed)
Office Visit Note   Patient: Sarah Booth           Date of Birth: 05/21/1961           MRN: [id number] Visit Date: 07/25/2019 Requested by: Chesley Noon, MD [place],  [place] [id number] PCP: Chesley Noon, MD  Subjective: Chief Complaint  Patient presents with  . Left Wrist - Pain    Small, painful mass on radial aspect of wrist x 2 weeks. Can feel pain into the thumb. Has been wearing a removable wrist splint - does help.  . Right Thumb - Pain    Thumb gets stuck at times - occasionally hurts. Started 3 weeks ago. Has had this in the past - cortisone injection helped. Right-hand dominant.    HPI: She is here with right thumb and left wrist pain.  Her thumb started triggering and flexion about 3 weeks ago.  No injury.  It happened 9 years ago and she got a cortisone injection which eliminated her pain until now.  Her left wrist developed a small nodule on the radial side about 2 weeks ago.  It is tender to touch, she is used Voltaren gel the past couple days with minimal improvement.  Denies any numbness or tingling.  She is right-hand dominant.  She works as a Merchandiser, retail.              ROS: No diabetes or hypothyroidism.  Denies fevers or chills.  All other systems were reviewed and are negative.  Objective: Vital Signs: LMP 05/07/2015 (Approximate)   Physical Exam:  General:  Alert and oriented, in no acute distress. Pulm:  Breathing unlabored. Psy:  Normal mood, congruent affect. Skin: No erythema or rash. Right thumb: She has a tender nodule at the A1 pulley and it triggers in flexion. Left wrist: She has a slightly firm but mobile nodule measuring about 4 mm diameter at the first dorsal compartment near the radial styloid.  Imaging: Limited diagnostic ultrasound left wrist: She has a ganglion cyst just superficial to the first dorsal compartment tendons.  No hyperemia on power Doppler imaging.   Assessment & Plan: 1.  Right trigger thumb  -Discussed options with her and she wants to try another cortisone injection.  She will follow-up as needed.  2.  Left wrist radial side ganglion cyst -Discussed options with her and she wants to try aspiration and injection.  Follow-up as needed.     Procedures: Right trigger thumb injection: After sterile prep with Betadine, injected 2 cc 1% lidocaine without epinephrine and 40 mg methylprednisolone into the region of the A1 pulley.  Left wrist ganglion cyst aspiration and injection: After sterile prep Betadine, injected 2 cc 1% lidocaine without epinephrine and then attempted aspiration with a 22-gauge needle but was unable to get any fluid, but using ultrasound guidance I made multiple passes through the cyst and then injected 20 mg methylprednisolone.  I was then able to flatten the cyst.     PMFS History: There are no active problems to display for this patient.  Past Medical History:  Diagnosis Date  . Migraine headache     Family History  Problem Relation Age of Onset  . Diabetes Sister   . Diabetes Sister   . Diabetes Sister   . Diabetes Sister   . Diabetes Sister   . Diabetes Sister     Past Surgical History:  Procedure Laterality Date  . BREAST BIOPSY    .  STOMACH SURGERY  2008   Tumor   Social History   Occupational History  . Not on file  Tobacco Use  . Smoking status: Never Smoker  Substance and Sexual Activity  . Alcohol use: No    Alcohol/week: 0.0 standard drinks  . Drug use: No  . Sexual activity: Not on file

## 2019-07-28 ENCOUNTER — Ambulatory Visit
Admission: RE | Admit: 2019-07-28 | Discharge: 2019-07-28 | Disposition: A | Discharge status: Home / Self Care | Payer: PRIVATE HEALTH INSURANCE | Source: Ambulatory Visit | Attending: Obstetrics and Gynecology | Admitting: Obstetrics and Gynecology

## 2019-07-28 ENCOUNTER — Other Ambulatory Visit: Payer: Self-pay

## 2019-07-28 DIAGNOSIS — Z1231 Encounter for screening mammogram for malignant neoplasm of breast: Secondary | ICD-10-CM

## 2019-08-28 ENCOUNTER — Other Ambulatory Visit: Payer: Self-pay | Admitting: *Deleted

## 2019-08-28 DIAGNOSIS — Z20822 Contact with and (suspected) exposure to covid-19: Secondary | ICD-10-CM

## 2019-08-31 LAB — NOVEL CORONAVIRUS, NAA: SARS-CoV-2, NAA: NOT DETECTED

## 2019-09-03 ENCOUNTER — Telehealth: Payer: Self-pay | Admitting: *Deleted

## 2019-09-03 NOTE — Telephone Encounter (Signed)
Patient calling to receive COVID-19 test results from 08/28/19. Pt states she was tested at the [facility] location. Pt informed that there has been a delay in receiving results on this particular day due to not being able to process the results electronically. Pt advised that results were not available at this time. Pt advised that [facility] would be contacted regarding results.Pt advised that she would be called and notified once results were received. Pt can be called at [phone] and patient states it is ok to leave a message if she does not answer.

## 2019-09-04 NOTE — Telephone Encounter (Signed)
Pt called and left message that results from 08/28/19 COVID-19 test was negative. Pt advised to retun call to [phone] with additional questions or concerns.

## 2019-09-11 ENCOUNTER — Emergency Department (HOSPITAL_COMMUNITY): Payer: PRIVATE HEALTH INSURANCE

## 2019-09-11 ENCOUNTER — Emergency Department (HOSPITAL_COMMUNITY)
Admission: EM | Admit: 2019-09-11 | Discharge: 2019-09-11 | Disposition: A | Discharge status: Home / Self Care | Payer: PRIVATE HEALTH INSURANCE | Source: Home / Self Care | Attending: Emergency Medicine | Admitting: Emergency Medicine

## 2019-09-11 ENCOUNTER — Encounter (HOSPITAL_COMMUNITY): Payer: Self-pay | Admitting: Emergency Medicine

## 2019-09-11 DIAGNOSIS — R0902 Hypoxemia: Secondary | ICD-10-CM | POA: Diagnosis not present

## 2019-09-11 DIAGNOSIS — Z79899 Other long term (current) drug therapy: Secondary | ICD-10-CM | POA: Insufficient documentation

## 2019-09-11 DIAGNOSIS — J129 Viral pneumonia, unspecified: Secondary | ICD-10-CM

## 2019-09-11 DIAGNOSIS — U071 COVID-19: Secondary | ICD-10-CM

## 2019-09-11 LAB — CBC WITH DIFFERENTIAL/PLATELET
Abs Immature Granulocytes: 0.01 10*3/uL (ref 0.00–0.07)
Basophils Absolute: 0 10*3/uL (ref 0.0–0.1)
Basophils Relative: 0 %
Eosinophils Absolute: 0 10*3/uL (ref 0.0–0.5)
Eosinophils Relative: 0 %
HCT: 38 % (ref 36.0–46.0)
Hemoglobin: 12.2 g/dL (ref 12.0–15.0)
Immature Granulocytes: 0 %
Lymphocytes Relative: 12 %
Lymphs Abs: 0.7 10*3/uL (ref 0.7–4.0)
MCH: 30 pg (ref 26.0–34.0)
MCHC: 32.1 g/dL (ref 30.0–36.0)
MCV: 93.6 fL (ref 80.0–100.0)
Monocytes Absolute: 0.3 10*3/uL (ref 0.1–1.0)
Monocytes Relative: 5 %
Neutro Abs: 4.7 10*3/uL (ref 1.7–7.7)
Neutrophils Relative %: 83 %
Platelets: 179 10*3/uL (ref 150–400)
RBC: 4.06 MIL/uL (ref 3.87–5.11)
RDW: 12.6 % (ref 11.5–15.5)
WBC: 5.7 10*3/uL (ref 4.0–10.5)
nRBC: 0 % (ref 0.0–0.2)

## 2019-09-11 LAB — BASIC METABOLIC PANEL
Anion gap: 11 (ref 5–15)
BUN: 15 mg/dL (ref 6–20)
CO2: 24 mmol/L (ref 22–32)
Calcium: 8.1 mg/dL — ABNORMAL LOW (ref 8.9–10.3)
Chloride: 102 mmol/L (ref 98–111)
Creatinine, Ser: 1.26 mg/dL — ABNORMAL HIGH (ref 0.44–1.00)
GFR calc Af Amer: 54 mL/min — ABNORMAL LOW (ref >60)
GFR calc non Af Amer: 46 mL/min — ABNORMAL LOW (ref >60)
Glucose, Bld: 98 mg/dL (ref 70–99)
Potassium: 3.7 mmol/L (ref 3.5–5.1)
Sodium: 137 mmol/L (ref 135–145)

## 2019-09-11 NOTE — ED Provider Notes (Signed)
[place] DEPT Provider Note   CSN: [id number] Arrival date & time: 09/11/19  1612     History   Chief Complaint Chief Complaint  Patient presents with  . COVID +  . Shortness of Breath    HPI Sarah Booth is a 58 y.o. female.  Presents to the department chief complaint cough, shortness of breath.  Patient states that she was recently diagnosed with COVID-19.  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  Tested positive on Monday, has had symptoms for the last few days.  States symptoms have been relatively constant over the last couple days, no significant change today.  States she just wants to be checked out.  Generalized body aches, no headache.  Cough is nonproductive.  Has not had fever today.  No vomiting.  Occasional loose stool but no blood in stool.       HPI  Past Medical History:  Diagnosis Date  . Migraine headache     There are no active problems to display for this patient.   Past Surgical History:  Procedure Laterality Date  . BREAST BIOPSY    . STOMACH SURGERY  2008   Tumor     OB History   No obstetric history on file.      Home Medications    Prior to Admission medications   Medication Sig Start Date End Date Taking? Authorizing Provider  buPROPion (WELLBUTRIN XL) 150 MG 24 hr tablet TAKE 1 TABLET BY MOUTH EVERY MORNING 05/22/19   [provider]  Multiple Vitamin (MULTIVITAMIN) tablet Take by mouth.    [provider]  SUMAtriptan (IMITREX) 100 MG tablet TAKE 1 TABLET BY MOUTH AS DIRECTED AS NEEDED 07/04/19   [provider]    Family History Family History  Problem Relation Age of Onset  . Diabetes Sister   . Diabetes Sister   . Diabetes Sister   . Diabetes Sister   . Diabetes Sister   . Diabetes Sister     Social History Social History   Tobacco Use  . Smoking status: Never Smoker  Substance Use Topics  . Alcohol use: No   Alcohol/week: 0.0 standard drinks  . Drug use: No     Allergies   Patient has no known allergies.   Review of Systems Review of Systems  Constitutional: Positive for chills, fatigue and fever.  HENT: Negative for ear pain and sore throat.   Eyes: Negative for pain and visual disturbance.  Respiratory: Positive for cough and shortness of breath.   Cardiovascular: Negative for chest pain and palpitations.  Gastrointestinal: Negative for abdominal pain and vomiting.  Genitourinary: Negative for dysuria and hematuria.  Musculoskeletal: Negative for arthralgias and back pain.  Skin: Negative for color change and rash.  Neurological: Negative for seizures and syncope.  All other systems reviewed and are negative.    Physical Exam Updated Vital Signs BP 115/68   Pulse 99   Temp 99.9 F (37.7 C) (Oral)   Resp (!) 27   LMP 05/07/2015 (Approximate)   SpO2 96%   Physical Exam Vitals signs and nursing note reviewed.  Constitutional:      General: She is not in acute distress.    Appearance: She is well-developed.  HENT:     Head: Normocephalic and atraumatic.  Eyes:     Conjunctiva/sclera: Conjunctivae normal.  Neck:     Musculoskeletal: Neck supple.  Cardiovascular:     Rate and  Rhythm: Normal rate and regular rhythm.     Heart sounds: No murmur.  Pulmonary:     Effort: Pulmonary effort is normal. No respiratory distress.     Breath sounds: Normal breath sounds.  Chest:     Chest wall: No mass or deformity.  Abdominal:     Palpations: Abdomen is soft.     Tenderness: There is no abdominal tenderness.  Musculoskeletal:     Right lower leg: No edema.     Left lower leg: No edema.  Skin:    General: Skin is warm and dry.  Neurological:     General: No focal deficit present.     Mental Status: She is alert.  Psychiatric:        Mood and Affect: Mood is anxious.      ED Treatments / Results  Labs (all labs ordered are listed, but only abnormal results are  displayed) Labs Reviewed  BASIC METABOLIC PANEL - Abnormal; Notable for the following components:      Result Value   Creatinine, Ser 1.26 (*)    Calcium 8.1 (*)    GFR calc non Af Amer 46 (*)    GFR calc Af Amer 54 (*)    All other components within normal limits  CBC WITH DIFFERENTIAL/PLATELET    EKG None  Radiology Dg Chest Portable 1 View  Result Date: 09/11/2019 CLINICAL DATA:  COVID.  Body aches, shortness of breath EXAM: PORTABLE CHEST 1 VIEW COMPARISON:  None. FINDINGS: Heart is borderline in size. Patchy bibasilar airspace opacities are noted could reflect atelectasis or infiltrates. No visible effusions. No acute bony abnormality. IMPRESSION: Borderline heart size. Bibasilar airspace opacities, atelectasis versus infiltrates/pneumonia. Electronically Signed   By: Rolm Baptise M.D.   On: 09/11/2019 19:11    Procedures Procedures (including critical care time)  Medications Ordered in ED Medications - No data to display   Initial Impression / Assessment and Plan / ED Course  I have reviewed the triage vital signs and the nursing notes.  Pertinent labs & imaging results that were available during my care of the patient were reviewed by me and considered in my medical decision making (see chart for details).  Clinical Course as of Sep 10 1928  Thu Sep 11, 2019  1928 Updated patient on results, will discharge home   [RD]    Clinical Course User Index [RD] Lucrezia Starch, MD       58 year old lady recent COVID-44 diagnosis presents to ER with cough, shortness of breath, myalgias.  Symptoms consistent with COVID-19 diagnosis.  Chest x-ray with findings consistent with Covid.  Labs stable.  She is well-appearing on exam, no significant respiratory difficulty.  Pulse ox remained stable while on her monitor.  She did well on ambulation trial.  Believe she is appropriate for further management as outpatient.  Reviewed strict return precautions though with patient and  recommended virtual recheck with her primary doctor.    After the discussed management above, the patient was determined to be safe for discharge.  The patient was in agreement with this plan and all questions regarding their care were answered.  ED return precautions were discussed and the patient will return to the ED with any significant worsening of condition.    Final Clinical Impressions(s) / ED Diagnoses   Final diagnoses:  COVID-19  Viral pneumonia    ED Discharge Orders    None       Lucrezia Starch, MD 09/12/19 [phone]

## 2019-09-11 NOTE — ED Notes (Signed)
Pt was verbalized discharge instructions. Pt had no further questions at this time. NAD. 

## 2019-09-11 NOTE — Discharge Instructions (Signed)
Recommend checking your oxygen levels periodically at home.  If your oxygen is dropping below 92%, you have ANY increased work of breathing, feel more short of breath, develop vomiting or other new concerning symptom please return to ER for reassessment.  Recommend scheduling a virtual appointment with your primary doctor within the next day or 2.

## 2019-09-11 NOTE — ED Notes (Signed)
Patient ambulatory in room without difficulty. Oxygen saturation remained above 94%.

## 2019-09-11 NOTE — ED Triage Notes (Signed)
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 Patient tested positive on Monday and reports she has been having worsening body aches and SOB. Patient was 100% on RA. Patient has reportedly clear lung sounds.

## 2019-09-11 NOTE — ED Notes (Signed)
XR at bedside

## 2019-09-13 ENCOUNTER — Other Ambulatory Visit: Payer: Self-pay

## 2019-09-13 ENCOUNTER — Encounter (HOSPITAL_COMMUNITY): Payer: Self-pay

## 2019-09-13 ENCOUNTER — Inpatient Hospital Stay (HOSPITAL_COMMUNITY)
Admission: EM | Admit: 2019-09-13 | Discharge: 2019-09-18 | DRG: 177 | Disposition: A | Discharge status: Home / Self Care | Payer: PRIVATE HEALTH INSURANCE | Attending: Family Medicine | Admitting: Family Medicine

## 2019-09-13 ENCOUNTER — Emergency Department (HOSPITAL_COMMUNITY): Payer: PRIVATE HEALTH INSURANCE

## 2019-09-13 DIAGNOSIS — F329 Major depressive disorder, single episode, unspecified: Secondary | ICD-10-CM | POA: Diagnosis present

## 2019-09-13 DIAGNOSIS — A0839 Other viral enteritis: Secondary | ICD-10-CM

## 2019-09-13 DIAGNOSIS — G43909 Migraine, unspecified, not intractable, without status migrainosus: Secondary | ICD-10-CM | POA: Diagnosis present

## 2019-09-13 DIAGNOSIS — R197 Diarrhea, unspecified: Secondary | ICD-10-CM | POA: Diagnosis present

## 2019-09-13 DIAGNOSIS — U071 COVID-19: Secondary | ICD-10-CM | POA: Diagnosis present

## 2019-09-13 DIAGNOSIS — J9601 Acute respiratory failure with hypoxia: Secondary | ICD-10-CM | POA: Diagnosis present

## 2019-09-13 DIAGNOSIS — R0902 Hypoxemia: Secondary | ICD-10-CM

## 2019-09-13 DIAGNOSIS — E876 Hypokalemia: Secondary | ICD-10-CM | POA: Diagnosis present

## 2019-09-13 DIAGNOSIS — J1289 Other viral pneumonia: Secondary | ICD-10-CM | POA: Diagnosis present

## 2019-09-13 DIAGNOSIS — Z833 Family history of diabetes mellitus: Secondary | ICD-10-CM | POA: Diagnosis not present

## 2019-09-13 LAB — COMPREHENSIVE METABOLIC PANEL
ALT: 33 U/L (ref 0–44)
AST: 41 U/L (ref 15–41)
Albumin: 3.3 g/dL — ABNORMAL LOW (ref 3.5–5.0)
Alkaline Phosphatase: 67 U/L (ref 38–126)
Anion gap: 11 (ref 5–15)
BUN: 16 mg/dL (ref 6–20)
CO2: 23 mmol/L (ref 22–32)
Calcium: 8 mg/dL — ABNORMAL LOW (ref 8.9–10.3)
Chloride: 102 mmol/L (ref 98–111)
Creatinine, Ser: 1.07 mg/dL — ABNORMAL HIGH (ref 0.44–1.00)
GFR calc Af Amer: >60 mL/min (ref >60)
GFR calc non Af Amer: 56 mL/min — ABNORMAL LOW (ref >60)
Glucose, Bld: 114 mg/dL — ABNORMAL HIGH (ref 70–99)
Potassium: 3.4 mmol/L — ABNORMAL LOW (ref 3.5–5.1)
Sodium: 136 mmol/L (ref 135–145)
Total Bilirubin: 0.8 mg/dL (ref 0.3–1.2)
Total Protein: 8 g/dL (ref 6.5–8.1)

## 2019-09-13 LAB — CBC WITH DIFFERENTIAL/PLATELET
Abs Immature Granulocytes: 0.05 10*3/uL (ref 0.00–0.07)
Basophils Absolute: 0 10*3/uL (ref 0.0–0.1)
Basophils Relative: 0 %
Eosinophils Absolute: 0 10*3/uL (ref 0.0–0.5)
Eosinophils Relative: 0 %
HCT: 36 % (ref 36.0–46.0)
Hemoglobin: 11.7 g/dL — ABNORMAL LOW (ref 12.0–15.0)
Immature Granulocytes: 1 %
Lymphocytes Relative: 9 %
Lymphs Abs: 0.6 10*3/uL — ABNORMAL LOW (ref 0.7–4.0)
MCH: 29.6 pg (ref 26.0–34.0)
MCHC: 32.5 g/dL (ref 30.0–36.0)
MCV: 91.1 fL (ref 80.0–100.0)
Monocytes Absolute: 0.4 10*3/uL (ref 0.1–1.0)
Monocytes Relative: 6 %
Neutro Abs: 5.9 10*3/uL (ref 1.7–7.7)
Neutrophils Relative %: 84 %
Platelets: 231 10*3/uL (ref 150–400)
RBC: 3.95 MIL/uL (ref 3.87–5.11)
RDW: 12.3 % (ref 11.5–15.5)
WBC: 7 10*3/uL (ref 4.0–10.5)
nRBC: 0 % (ref 0.0–0.2)

## 2019-09-13 LAB — LACTIC ACID, PLASMA
Lactic Acid, Venous: 0.9 mmol/L (ref 0.5–1.9)
Lactic Acid, Venous: 0.8 mmol/L (ref 0.5–1.9)

## 2019-09-13 LAB — PROCALCITONIN: Procalcitonin: <0.1 ng/mL

## 2019-09-13 LAB — TROPONIN I (HIGH SENSITIVITY)
Troponin I (High Sensitivity): 4 ng/L (ref <18)
Troponin I (High Sensitivity): 4 ng/L (ref <18)

## 2019-09-13 LAB — TRIGLYCERIDES: Triglycerides: 249 mg/dL — ABNORMAL HIGH (ref <150)

## 2019-09-13 LAB — HIV ANTIBODY (ROUTINE TESTING W REFLEX): HIV Screen 4th Generation wRfx: NONREACTIVE

## 2019-09-13 LAB — C-REACTIVE PROTEIN: CRP: 14.6 mg/dL — ABNORMAL HIGH (ref <1.0)

## 2019-09-13 LAB — LACTATE DEHYDROGENASE: LDH: 288 U/L — ABNORMAL HIGH (ref 98–192)

## 2019-09-13 LAB — D-DIMER, QUANTITATIVE: D-Dimer, Quant: 0.54 ug/mL-FEU — ABNORMAL HIGH (ref 0.00–0.50)

## 2019-09-13 LAB — FIBRINOGEN: Fibrinogen: >800 mg/dL — ABNORMAL HIGH (ref 210–475)

## 2019-09-13 LAB — FERRITIN: Ferritin: 392 ng/mL — ABNORMAL HIGH (ref 11–307)

## 2019-09-13 MED ORDER — ONDANSETRON HCL 4 MG/2ML IJ SOLN: 4.0000 mg | Freq: Four times a day (QID) | INTRAMUSCULAR | Status: DC | PRN

## 2019-09-13 MED ORDER — POTASSIUM CHLORIDE CRYS ER 20 MEQ PO TBCR
40.0000 meq | EXTENDED_RELEASE_TABLET | Freq: Once | ORAL | Status: AC
  Administered 2019-09-13: 16:00:00 40 meq via ORAL
  Filled 2019-09-13: qty 2

## 2019-09-13 MED ORDER — ALBUTEROL SULFATE HFA 108 (90 BASE) MCG/ACT IN AERS
1.0000 | INHALATION_SPRAY | RESPIRATORY_TRACT | Status: DC | PRN
  Administered 2019-09-14 – 2019-09-16 (×4): 2 via RESPIRATORY_TRACT
  Filled 2019-09-13: qty 6.7

## 2019-09-13 MED ORDER — ENOXAPARIN SODIUM 40 MG/0.4ML ~~LOC~~ SOLN
40.0000 mg | SUBCUTANEOUS | Status: DC
  Administered 2019-09-13 – 2019-09-17 (×5): 40 mg via SUBCUTANEOUS
  Filled 2019-09-13 (×6): qty 0.4

## 2019-09-13 MED ORDER — DEXAMETHASONE SODIUM PHOSPHATE 10 MG/ML IJ SOLN
10.0000 mg | Freq: Once | INTRAMUSCULAR | Status: AC
  Administered 2019-09-13: 14:00:00 10 mg via INTRAVENOUS
  Filled 2019-09-13: qty 1

## 2019-09-13 MED ORDER — SUMATRIPTAN SUCCINATE 50 MG PO TABS
100.0000 mg | ORAL_TABLET | ORAL | Status: DC | PRN
  Filled 2019-09-13: qty 2
  Filled 2019-09-13: qty 1

## 2019-09-13 MED ORDER — ONDANSETRON HCL 4 MG PO TABS: 4.0000 mg | ORAL_TABLET | Freq: Four times a day (QID) | ORAL | Status: DC | PRN

## 2019-09-13 MED ORDER — SODIUM CHLORIDE 0.9% FLUSH
3.0000 mL | Freq: Two times a day (BID) | INTRAVENOUS | Status: DC
  Administered 2019-09-14 – 2019-09-17 (×6): 3 mL via INTRAVENOUS
  Administered 2019-09-17: 09:00:00 via INTRAVENOUS
  Administered 2019-09-18: 09:00:00 3 mL via INTRAVENOUS

## 2019-09-13 MED ORDER — ACETAMINOPHEN 325 MG PO TABS
650.0000 mg | ORAL_TABLET | Freq: Four times a day (QID) | ORAL | Status: DC | PRN
  Administered 2019-09-14: 650 mg via ORAL
  Filled 2019-09-13: qty 2

## 2019-09-13 MED ORDER — ADULT MULTIVITAMIN W/MINERALS CH
1.0000 | ORAL_TABLET | Freq: Every day | ORAL | Status: DC
  Administered 2019-09-14 – 2019-09-18 (×5): 1 via ORAL
  Filled 2019-09-13 (×7): qty 1

## 2019-09-13 MED ORDER — SODIUM CHLORIDE 0.9% FLUSH: 3.0000 mL | INTRAVENOUS | Status: DC | PRN

## 2019-09-13 MED ORDER — GUAIFENESIN-DM 100-10 MG/5ML PO SYRP
5.0000 mL | ORAL_SOLUTION | ORAL | Status: DC | PRN
  Administered 2019-09-13 – 2019-09-17 (×9): 5 mL via ORAL
  Filled 2019-09-13 (×7): qty 10
  Filled 2019-09-13: qty 5
  Filled 2019-09-13 (×2): qty 10

## 2019-09-13 MED ORDER — ACETAMINOPHEN 500 MG PO TABS
1000.0000 mg | ORAL_TABLET | Freq: Once | ORAL | Status: AC
  Administered 2019-09-13: 14:00:00 1000 mg via ORAL
  Filled 2019-09-13: qty 2

## 2019-09-13 MED ORDER — SODIUM CHLORIDE 0.9 % IV SOLN
250.0000 mL | INTRAVENOUS | Status: DC | PRN
  Administered 2019-09-14: 07:00:00 250 mL via INTRAVENOUS

## 2019-09-13 MED ORDER — BUPROPION HCL ER (XL) 150 MG PO TB24
150.0000 mg | ORAL_TABLET | Freq: Every morning | ORAL | Status: DC
  Administered 2019-09-14 – 2019-09-18 (×5): 150 mg via ORAL
  Filled 2019-09-13 (×6): qty 1

## 2019-09-13 MED ORDER — SODIUM CHLORIDE 0.9 % IV SOLN
500.0000 mg | INTRAVENOUS | Status: DC
  Administered 2019-09-13: 15:00:00 500 mg via INTRAVENOUS
  Filled 2019-09-13: qty 500

## 2019-09-13 MED ORDER — ACETAMINOPHEN 650 MG RE SUPP: 650.0000 mg | Freq: Four times a day (QID) | RECTAL | Status: DC | PRN

## 2019-09-13 MED ORDER — CEFTRIAXONE SODIUM 2 G IJ SOLR
2.0000 g | INTRAMUSCULAR | Status: DC
  Administered 2019-09-13: 2 g via INTRAVENOUS
  Filled 2019-09-13: qty 20

## 2019-09-13 NOTE — ED Provider Notes (Signed)
Emergency Department Provider Note   I have reviewed the triage vital signs and the nursing notes.   HISTORY  Chief Complaint covid   HPI Sarah Booth is a 58 y.o. female presents to the emergency department with worsening shortness of breath and hypoxemia in the setting of recent COVID-19 diagnosis.  Patient reports testing positive for COVID-19 through the health department on Tuesday.  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  She began having symptoms earlier this week including shortness of breath but no hypoxemia.  She presented to the emergency department with work-up consistent with COVID-19 but not requiring oxygen at that time.  She states that she returned home and was checking her oxygen saturation and noticed that shortly afterwards it began to dip into the mid 80s.  She has been self administering oxygen at home which she is able to obtain from work but states that today her shortness of breath has become significantly worse.  She had a fever last night of 104 F and treated with Tylenol.  She reports associated cough and diarrhea.  She does note an associated central chest pressure.  No sharp/stabbing pains. No history of asthma or COPD.   Past Medical History:  Diagnosis Date  . Migraine headache     Patient Active Problem List   Diagnosis Date Noted  . Acute hypoxemic respiratory failure ([place]) 09/13/2019    Past Surgical History:  Procedure Laterality Date  . BREAST BIOPSY    . STOMACH SURGERY  2008   Tumor    Allergies Patient has no known allergies.  Family History  Problem Relation Age of Onset  . Diabetes Sister   . Diabetes Sister   . Diabetes Sister   . Diabetes Sister   . Diabetes Sister   . Diabetes Sister     Social History Social History   Tobacco Use  . Smoking status: Never Smoker  . Smokeless tobacco: Never Used  Substance Use Topics  . Alcohol use: No    Alcohol/week: 0.0 standard drinks  .  Drug use: No    Review of Systems  Constitutional: No fever/chills Eyes: No visual changes. ENT: No sore throat. Cardiovascular: Positive chest pain. Respiratory: Positive shortness of breath and cough.  Gastrointestinal: No abdominal pain.  No nausea, no vomiting. Positive diarrhea.  No constipation. Genitourinary: Negative for dysuria. Musculoskeletal: Negative for back pain. Skin: Negative for rash. Neurological: Negative for headaches, focal weakness or numbness.  10-point ROS otherwise negative.  ____________________________________________   PHYSICAL EXAM:  VITAL SIGNS: ED Triage Vitals  Enc Vitals Group     BP 09/13/19 1312 (!) 144/81     Pulse Rate 09/13/19 1312 98     Resp 09/13/19 1312 (!) 32     Temp 09/13/19 1312 (!) 102.6 F (39.2 C)     Temp Source 09/13/19 1312 Oral     SpO2 09/13/19 1312 99 %     Weight 09/13/19 1309 162 lb (73.5 kg)     Height 09/13/19 1309 5\' 4"  (1.626 m)   Constitutional: Alert and oriented. Well appearing and in no acute distress. Eyes: Conjunctivae are normal. Head: Atraumatic. Nose: No congestion/rhinnorhea. Mouth/Throat: Mucous membranes are moist.   Neck: No stridor.   Cardiovascular: Normal rate, regular rhythm. Good peripheral circulation. Grossly normal heart sounds.   Respiratory: Increased respiratory effort.  No retractions. Lungs CTAB. Gastrointestinal: Soft and nontender. No distention.  Musculoskeletal: No gross deformities of extremities. Neurologic:  Normal speech and language.  Skin:  Skin is warm, dry and intact. No rash noted.   ____________________________________________   LABS (all labs ordered are listed, but only abnormal results are displayed)  Labs Reviewed  CBC WITH DIFFERENTIAL/PLATELET - Abnormal; Notable for the following components:      Result Value   Hemoglobin 11.7 (*)    Lymphs Abs 0.6 (*)    All other components within normal limits  COMPREHENSIVE METABOLIC PANEL - Abnormal; Notable  for the following components:   Potassium 3.4 (*)    Glucose, Bld 114 (*)    Creatinine, Ser 1.07 (*)    Calcium 8.0 (*)    Albumin 3.3 (*)    GFR calc non Af Amer 56 (*)    All other components within normal limits  D-DIMER, QUANTITATIVE (NOT AT [facility]) - Abnormal; Notable for the following components:   D-Dimer, Quant 0.54 (*)    All other components within normal limits  LACTATE DEHYDROGENASE - Abnormal; Notable for the following components:   LDH 288 (*)    All other components within normal limits  FERRITIN - Abnormal; Notable for the following components:   Ferritin 392 (*)    All other components within normal limits  TRIGLYCERIDES - Abnormal; Notable for the following components:   Triglycerides 249 (*)    All other components within normal limits  FIBRINOGEN - Abnormal; Notable for the following components:   Fibrinogen >800 (*)    All other components within normal limits  C-REACTIVE PROTEIN - Abnormal; Notable for the following components:   CRP 14.6 (*)    All other components within normal limits  CULTURE, BLOOD (ROUTINE X 2)  CULTURE, BLOOD (ROUTINE X 2)  SARS CORONAVIRUS 2 (TAT 6-24 HRS)  LACTIC ACID, PLASMA  LACTIC ACID, PLASMA  PROCALCITONIN  HIV ANTIBODY (ROUTINE TESTING W REFLEX)  BASIC METABOLIC PANEL  CBC  MAGNESIUM  POC URINE PREG, ED  TROPONIN I (HIGH SENSITIVITY)  TROPONIN I (HIGH SENSITIVITY)   ____________________________________________  EKG   EKG Interpretation  Date/Time:  Saturday September 13 2019 13:12:17 EST Ventricular Rate:  99 PR Interval:    QRS Duration: 86 QT Interval:  334 QTC Calculation: 429 R Axis:   5 Text Interpretation: Sinus rhythm Ventricular premature complex Low voltage, precordial leads Nonspecific T abnrm, anterolateral leads No STEMI Confirmed by Nanda Quinton [phone]) on 09/13/2019 1:37:08 PM       ____________________________________________  RADIOLOGY  Dg Chest Port 1 View  Result Date: 09/13/2019  CLINICAL DATA:  Patient with recent diagnosis of COVID-19. EXAM: PORTABLE CHEST 1 VIEW COMPARISON:  Chest radiograph 09/11/2019. FINDINGS: Monitoring leads overlie the patient. Stable enlarged cardiac and mediastinal contours. Interval worsening bilateral predominately mid and lower lung patchy areas of consolidation. No pleural effusion or pneumothorax. IMPRESSION: Worsening bilateral peripheral consolidation which may be secondary to atypical viral infection or pneumonia. Electronically Signed   By: Lovey Newcomer M.D.   On: 09/13/2019 14:16    ____________________________________________   PROCEDURES  Procedure(s) performed:   Procedures  CRITICAL CARE Performed by: Margette Fast Total critical care time: 35 minutes Critical care time was exclusive of separately billable procedures and treating other patients. Critical care was necessary to treat or prevent imminent or life-threatening deterioration. Critical care was time spent personally by me on the following activities: development of treatment plan with patient and/or surrogate as well as nursing, discussions with consultants, evaluation of patient's response to treatment, examination of patient, obtaining history from patient or  surrogate, ordering and performing treatments and interventions, ordering and review of laboratory studies, ordering and review of radiographic studies, pulse oximetry and re-evaluation of patient's condition.  Nanda Quinton, MD Emergency Medicine  ____________________________________________   INITIAL IMPRESSION / ASSESSMENT AND PLAN / ED COURSE  Pertinent labs & imaging results that were available during my care of the patient were reviewed by me and considered in my medical decision making (see chart for details).   Patient presents to the emergency department with worsening shortness of breath and hypoxemia.  She is febrile and tachypneic here and currently requiring 4 L nasal cannula oxygen to maintain  her oxygen saturations.  She has been self administering oxygen at home the past several days.  Chest x-ray from her recent ED visit shows bilateral infiltrate.  Her Covid 19 test from that visit did come back negative.  She states she had a positive test at the health department.  Repeat testing here but treatment is of COVID-19 positive.  Will give Decadron, antibiotics until secondary bacterial infection can be ruled out, and Tylenol for fever.  Monitor closely but likely admit.   Patient's secondary COVID labs suggestive of COIVD. No beds at [facility] and will not take patient without confirmed COVID test in out system. Imaging reviewed. Patient given abx, steroid, and tylenol. Plan for admit as PUI unless COVID test can be confirmed or our repeat test results positive.   Discussed patient's case with [facility], Dr. Manuella Ghazi to request admission. Patient and family (if present) updated with plan. Care transferred to [facility] service.  I reviewed all nursing notes, vitals, pertinent old records, EKGs, labs, imaging (as available).  ____________________________________________  FINAL CLINICAL IMPRESSION(S) / ED DIAGNOSES  Final diagnoses:  COVID-19  Hypoxemia     MEDICATIONS GIVEN DURING THIS VISIT:  Medications  SUMAtriptan (IMITREX) tablet 100 mg (has no administration in time range)  buPROPion (WELLBUTRIN XL) 24 hr tablet 150 mg (has no administration in time range)  multivitamin with minerals tablet 1 tablet (has no administration in time range)  enoxaparin (LOVENOX) injection 40 mg (has no administration in time range)  sodium chloride flush (NS) 0.9 % injection 3 mL (has no administration in time range)  sodium chloride flush (NS) 0.9 % injection 3 mL (has no administration in time range)  0.9 %  sodium chloride infusion (has no administration in time range)  acetaminophen (TYLENOL) tablet 650 mg (has no administration in time range)    Or  acetaminophen (TYLENOL) suppository 650 mg (has no  administration in time range)  ondansetron (ZOFRAN) tablet 4 mg (has no administration in time range)    Or  ondansetron (ZOFRAN) injection 4 mg (has no administration in time range)  guaiFENesin-dextromethorphan (ROBITUSSIN DM) 100-10 MG/5ML syrup 5 mL (has no administration in time range)  albuterol (VENTOLIN HFA) 108 (90 Base) MCG/ACT inhaler 1-2 puff (has no administration in time range)  acetaminophen (TYLENOL) tablet 1,000 mg (1,000 mg Oral Given 09/13/19 1337)  dexamethasone (DECADRON) injection 10 mg (10 mg Intravenous Given 09/13/19 1337)  potassium chloride SA (KLOR-CON) CR tablet 40 mEq (40 mEq Oral Given 09/13/19 1626)     Note:  This document was prepared using Dragon voice recognition software and may include unintentional dictation errors.  Nanda Quinton, MD, [facility] Emergency Medicine    Estee Yohe, [facility], MD 09/13/19 [phone]

## 2019-09-13 NOTE — ED Triage Notes (Signed)
Pt reports was diagnosed with covid Monday.  C/O cough, fever, sob, diarrhea.  Reports sob worse at night when she lays down.  Pt says o2 sat was 83% laying down.  Pt c/o chest pain in center of chest.  Rates at 5.

## 2019-09-13 NOTE — ED Notes (Signed)
Pt provided bedside commode and meal tray.

## 2019-09-13 NOTE — H&P (Signed)
History and Physical    Sarah Booth [id number] DOB: 05-09-1961 DOA: 09/13/2019  PCP: Chesley Noon, MD   Patient coming from: Home  Chief Complaint: Dyspnea with hypoxemia  HPI: Sarah Booth is a 58 y.o. female with medical history significant for migraine headaches who has had a recent positive Covid test at the health department on Monday of this past week.  She has been having worsening shortness of breath since her diagnosis along with some intermittent fevers for which she presented to the ED at [facility] on Thursday of this past week.  She was also noted to have some generalized body aches as well as nonproductive cough.  She has also had intermittent diarrhea.  She was sent home at that time to quarantine since she had stable labs and was well-appearing on her exam with no significant respiratory difficulty.  Her pulse oximetry was also stable at that time.  Since then, she has been noticing worsening hypoxemia and shortness of breath.  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  She had a fever last night of 104 F and took some Tylenol for it with minimal improvement.   ED Course: Vital signs are actually stable and patient does have fever 102.6 Fahrenheit.  Laboratory data demonstrates procalcitonin less than 0.10 and lactic acid 0.9.  Creatinine is stable at 1.07 and she has potassium of 3.4.  Chest x-ray with atypical findings suggestive of viral pneumonia.  EKG at 99 bpm with sinus rhythm.  She is noted to have ferritin 392, CRP 14.6, lactic acid 0.9, D-dimer 0.54, and fibrinogen greater than 800.  She is currently on 4 L nasal cannula oxygen and does not appear to be tachypneic.  Her WBC is 7.0 and she was given dexamethasone along with azithromycin and Rocephin.  COVID-19 test in the ED is currently pending.  Patient unfortunately does not have documentation from the health department about  her Covid positive test this past Monday.  Review of Systems: All others reviewed and otherwise negative except as noted above.  Past Medical History:  Diagnosis Date  . Migraine headache     Past Surgical History:  Procedure Laterality Date  . BREAST BIOPSY    . STOMACH SURGERY  2008   Tumor     reports that she has never smoked. She has never used smokeless tobacco. She reports that she does not drink alcohol or use drugs.  No Known Allergies  Family History  Problem Relation Age of Onset  . Diabetes Sister   . Diabetes Sister   . Diabetes Sister   . Diabetes Sister   . Diabetes Sister   . Diabetes Sister     Prior to Admission medications   Medication Sig Start Date End Date Taking? Authorizing Provider  buPROPion (WELLBUTRIN XL) 150 MG 24 hr tablet TAKE 1 TABLET BY MOUTH EVERY MORNING 05/22/19   [provider]  Multiple Vitamin (MULTIVITAMIN) tablet Take by mouth.    [provider]  SUMAtriptan (IMITREX) 100 MG tablet TAKE 1 TABLET BY MOUTH AS DIRECTED AS NEEDED 07/04/19   [provider]    Physical Exam: Vitals:   09/13/19 1415 09/13/19 1430 09/13/19 1445 09/13/19 1530  BP:      Pulse: 89 90 87 86  Resp: (!) 28 (!) 28 (!) 30 (!) 28  Temp:      TempSrc:  SpO2: 99% 98% 98% 100%  Weight:      Height:        Constitutional: NAD, calm, comfortable Vitals:   09/13/19 1415 09/13/19 1430 09/13/19 1445 09/13/19 1530  BP:      Pulse: 89 90 87 86  Resp: (!) 28 (!) 28 (!) 30 (!) 28  Temp:      TempSrc:      SpO2: 99% 98% 98% 100%  Weight:      Height:       Eyes: lids and conjunctivae normal ENMT: Mucous membranes are moist.  Neck: normal, supple Respiratory: clear to auscultation bilaterally. Normal respiratory effort. No accessory muscle use. Currently on 4L [place] with no respiratory distress. Cardiovascular: Regular rate and rhythm, no murmurs. No extremity edema. Abdomen: no tenderness, no distention. Bowel sounds positive.   Musculoskeletal:  No joint deformity upper and lower extremities.   Skin: no rashes, lesions, ulcers.  Psychiatric: Normal judgment and insight. Alert and oriented x 3. Normal mood.   Labs on Admission: I have personally reviewed following labs and imaging studies  CBC: Recent Labs  Lab 09/11/19 1810 09/13/19 1331  WBC 5.7 7.0  NEUTROABS 4.7 5.9  HGB 12.2 11.7*  HCT 38.0 36.0  MCV 93.6 91.1  PLT 179 [id number]   Basic Metabolic Panel: Recent Labs  Lab 09/11/19 1810 09/13/19 1331  NA 137 136  K 3.7 3.4*  CL 102 102  CO2 24 23  GLUCOSE 98 114*  BUN 15 16  CREATININE 1.26* 1.07*  CALCIUM 8.1* 8.0*   GFR: Estimated Creatinine Clearance: 54.9 mL/min (A) (by C-G formula based on SCr of 1.07 mg/dL (H)). Liver Function Tests: Recent Labs  Lab 09/13/19 1331  AST 41  ALT 33  ALKPHOS 67  BILITOT 0.8  PROT 8.0  ALBUMIN 3.3*   No results for input(s): LIPASE, AMYLASE in the last 168 hours. No results for input(s): AMMONIA in the last 168 hours. Coagulation Profile: No results for input(s): INR, PROTIME in the last 168 hours. Cardiac Enzymes: No results for input(s): CKTOTAL, CKMB, CKMBINDEX, TROPONINI in the last 168 hours. BNP (last 3 results) No results for input(s): PROBNP in the last 8760 hours. HbA1C: No results for input(s): HGBA1C in the last 72 hours. CBG: No results for input(s): GLUCAP in the last 168 hours. Lipid Profile: Recent Labs    09/13/19 1331  TRIG 249*   Thyroid Function Tests: No results for input(s): TSH, T4TOTAL, FREET4, T3FREE, THYROIDAB in the last 72 hours. Anemia Panel: Recent Labs    09/13/19 1331  FERRITIN 392*   Urine analysis:    Component Value Date/Time   COLORURINE YELLOW 07/30/2013 0939   APPEARANCEUR CLOUDY (A) 07/30/2013 0939   LABSPEC 1.010 07/30/2013 0939   PHURINE 7.0 07/30/2013 0939   GLUCOSEU NEGATIVE 07/30/2013 0939   HGBUR NEGATIVE 07/30/2013 0939   BILIRUBINUR NEGATIVE 07/30/2013 0939   KETONESUR 15 (A)  07/30/2013 0939   PROTEINUR NEGATIVE 07/30/2013 0939   UROBILINOGEN 0.2 07/30/2013 0939   NITRITE NEGATIVE 07/30/2013 0939   LEUKOCYTESUR TRACE (A) 07/30/2013 0939    Radiological Exams on Admission: Dg Chest Port 1 View  Result Date: 09/13/2019 CLINICAL DATA:  Patient with recent diagnosis of COVID-19. EXAM: PORTABLE CHEST 1 VIEW COMPARISON:  Chest radiograph 09/11/2019. FINDINGS: Monitoring leads overlie the patient. Stable enlarged cardiac and mediastinal contours. Interval worsening bilateral predominately mid and lower lung patchy areas of consolidation. No pleural effusion or pneumothorax. IMPRESSION: Worsening bilateral peripheral consolidation which may be secondary  to atypical viral infection or pneumonia. Electronically Signed   By: Lovey Newcomer M.D.   On: 09/13/2019 14:16   Dg Chest Portable 1 View  Result Date: 09/11/2019 CLINICAL DATA:  COVID.  Body aches, shortness of breath EXAM: PORTABLE CHEST 1 VIEW COMPARISON:  None. FINDINGS: Heart is borderline in size. Patchy bibasilar airspace opacities are noted could reflect atelectasis or infiltrates. No visible effusions. No acute bony abnormality. IMPRESSION: Borderline heart size. Bibasilar airspace opacities, atelectasis versus infiltrates/pneumonia. Electronically Signed   By: Rolm Baptise M.D.   On: 09/11/2019 19:11    EKG: Independently reviewed.  Sinus rhythm 99 bpm.  Assessment/Plan Active Problems:   Acute hypoxemic respiratory failure (HCC)    Acute hypoxemic respiratory failure secondary to COVID-19 pneumonia -Patient does not have any leukocytosis or procalcitonin elevation, will hold further antibiotics -Patient given dexamethasone in ED -Remdesivir consult placed -Lovenox for DVT prophylaxis -Transfer to [facility] as a person under investigation is Covid result has not returned in ED -Regular diet  Mild hypokalemia -Replete and reevaluate in a.m.  History of migraine headaches -Continue home medications    DVT prophylaxis: Lovenox Code Status: Full Family Communication: None at bedside Disposition Plan: Transfer to [facility] for treatment of Covid pneumonia Consults called: None Admission status: Inpatient, MedSurg    Darleen Crocker DO [facility] Pager [phone]  If 7PM-7AM, please contact night-coverage [email or web address] Password [facility]  09/13/2019, 3:32 PM

## 2019-09-14 ENCOUNTER — Other Ambulatory Visit: Payer: Self-pay

## 2019-09-14 ENCOUNTER — Encounter (HOSPITAL_COMMUNITY): Payer: Self-pay | Admitting: *Deleted

## 2019-09-14 DIAGNOSIS — E876 Hypokalemia: Secondary | ICD-10-CM

## 2019-09-14 DIAGNOSIS — J1289 Other viral pneumonia: Secondary | ICD-10-CM

## 2019-09-14 DIAGNOSIS — U071 COVID-19: Principal | ICD-10-CM

## 2019-09-14 DIAGNOSIS — A0839 Other viral enteritis: Secondary | ICD-10-CM

## 2019-09-14 LAB — SARS CORONAVIRUS 2 (TAT 6-24 HRS): SARS Coronavirus 2: POSITIVE — AB

## 2019-09-14 MED ORDER — SODIUM CHLORIDE 0.9 % IV SOLN
200.0000 mg | Freq: Once | INTRAVENOUS | Status: AC
  Administered 2019-09-14: 07:00:00 200 mg via INTRAVENOUS
  Filled 2019-09-14: qty 40

## 2019-09-14 MED ORDER — SODIUM CHLORIDE 0.9 % IV SOLN
100.0000 mg | INTRAVENOUS | Status: AC
  Administered 2019-09-15 – 2019-09-18 (×4): 100 mg via INTRAVENOUS
  Filled 2019-09-14 (×4): qty 20

## 2019-09-14 MED ORDER — DEXAMETHASONE 6 MG PO TABS
6.0000 mg | ORAL_TABLET | Freq: Two times a day (BID) | ORAL | Status: DC
  Administered 2019-09-14 – 2019-09-18 (×9): 6 mg via ORAL
  Filled 2019-09-14 (×10): qty 1

## 2019-09-14 NOTE — Progress Notes (Signed)
Spoke with patient's daughter while in the room with patient. Updated daughter on patient's condition and answered any questions she had.

## 2019-09-14 NOTE — Plan of Care (Signed)
Initial care plan started.

## 2019-09-14 NOTE — ED Notes (Signed)
Date and time results received: 09/14/19 02:45 (use smartphrase ".now" to insert current time)  Test: covid Critical Value: positive  Name of Provider Notified: Dr Scherrie November  Orders Received? Or Actions Taken?:see emr

## 2019-09-14 NOTE — Progress Notes (Signed)
[facility] PROGRESS NOTE    Progress Note  Sarah Booth  [id number] DOB: December 21, 1960 DOA: 09/13/2019 PCP: Chesley Noon, MD     Brief Narrative:   Sarah Booth is an 58 y.o. female past medical history significant for migraines at a recent Covid 19+ test at the health department on 09/08/2019.  Has been having progressive shortness of breath along with intermittent fevers, she presented to the ED at [facility] on Thursday with generalized ache, diarrhea nonproductive cough and shortness of breath condition was stable at that time she was discharged home comes back on 09/13/2019, was found to be hypoxic was placed on 2 L of nasal cannula with improvement in his saturations.  Assessment/Plan:   Acute hypoxemic respiratory failure due to Pneumonia due to COVID-19 virus She is currently greater than 94% on 2 L of oxygen. Continue IV remdesivir and steroids. Try to keep the patient prone for at least 16 hours a day. Inflammatory markers are significantly up, will continue to monitor daily. Pro-Calcitonin less than 0.1.  Hypokalemia: Mild replete orally recheck in the morning.  Diarrhea due to COVID-19: She relates her diarrhea is improving   DVT prophylaxis: lovenox Family Communication:none Disposition Plan/Barrier to D/C: home in 5 days Code Status:     Code Status Orders  (From admission, onward)         Start     Ordered   09/13/19 1547  Full code  Continuous     09/13/19 1548        Code Status History    This patient has a current code status but no historical code status.   Advance Care Planning Activity        IV Access:    Peripheral IV   Procedures and diagnostic studies:   Dg Chest Port 1 View  Result Date: 09/13/2019 CLINICAL DATA:  Patient with recent diagnosis of COVID-19. EXAM: PORTABLE CHEST 1 VIEW COMPARISON:  Chest radiograph 09/11/2019. FINDINGS: Monitoring leads overlie the patient. Stable enlarged cardiac and  mediastinal contours. Interval worsening bilateral predominately mid and lower lung patchy areas of consolidation. No pleural effusion or pneumothorax. IMPRESSION: Worsening bilateral peripheral consolidation which may be secondary to atypical viral infection or pneumonia. Electronically Signed   By: Lovey Newcomer M.D.   On: 09/13/2019 14:16     Medical Consultants:    None.  Anti-Infectives:   IV remdesivir  Subjective:    [name] relates she feels tired, she relates her diarrhea has slowed down he is only had 2 bowel movement this morning.  Objective:    Vitals:   09/14/19 0400 09/14/19 0528 09/14/19 0600 09/14/19 0700  BP: 135/76 (!) 145/84    Pulse: 83 85 87 86  Resp: 13 (!) 28 20 (!) 24  Temp:  98.7 F (37.1 C)    TempSrc:  Oral    SpO2: 97% 93% 96% 95%  Weight:      Height:       SpO2: 95 % O2 Flow Rate (L/min): 2 L/min   Intake/Output Summary (Last 24 hours) at 09/14/2019 0747 Last data filed at 09/13/2019 1545 Gross per 24 hour  Intake 350 ml  Output -  Net 350 ml   Filed Weights   09/13/19 1309  Weight: 73.5 kg    Exam: General exam: In no acute distress. Respiratory system: Good air movement and clear to auscultation. Cardiovascular system: S1 & S2 heard, RRR. No JVD. Gastrointestinal system: Positive bowel sounds soft nontender nondistended. Central nervous  system: Alert and oriented. No focal neurological deficits. Extremities: No pedal edema. Skin: No rashes, lesions or ulcers Psychiatry: Judgement and insight appear normal. Mood & affect appropriate.    Data Reviewed:    Labs: Basic Metabolic Panel: Recent Labs  Lab 09/11/19 1810 09/13/19 1331  NA 137 136  K 3.7 3.4*  CL 102 102  CO2 24 23  GLUCOSE 98 114*  BUN 15 16  CREATININE 1.26* 1.07*  CALCIUM 8.1* 8.0*   GFR Estimated Creatinine Clearance: 54.9 mL/min (A) (by C-G formula based on SCr of 1.07 mg/dL (H)). Liver Function Tests: Recent Labs  Lab 09/13/19 1331   AST 41  ALT 33  ALKPHOS 67  BILITOT 0.8  PROT 8.0  ALBUMIN 3.3*   No results for input(s): LIPASE, AMYLASE in the last 168 hours. No results for input(s): AMMONIA in the last 168 hours. Coagulation profile No results for input(s): INR, PROTIME in the last 168 hours. COVID-19 Labs  Recent Labs    09/13/19 1331  DDIMER 0.54*  FERRITIN 392*  LDH 288*  CRP 14.6*    Lab Results  Component Value Date   SARSCOV2NAA POSITIVE (A) 09/13/2019   [place] Not Detected 08/28/2019    CBC: Recent Labs  Lab 09/11/19 1810 09/13/19 1331  WBC 5.7 7.0  NEUTROABS 4.7 5.9  HGB 12.2 11.7*  HCT 38.0 36.0  MCV 93.6 91.1  PLT 179 231   Cardiac Enzymes: No results for input(s): CKTOTAL, CKMB, CKMBINDEX, TROPONINI in the last 168 hours. BNP (last 3 results) No results for input(s): PROBNP in the last 8760 hours. CBG: No results for input(s): GLUCAP in the last 168 hours. D-Dimer: Recent Labs    09/13/19 1331  DDIMER 0.54*   Hgb A1c: No results for input(s): HGBA1C in the last 72 hours. Lipid Profile: Recent Labs    09/13/19 1331  TRIG 249*   Thyroid function studies: No results for input(s): TSH, T4TOTAL, T3FREE, THYROIDAB in the last 72 hours.  Invalid input(s): FREET3 Anemia work up: Recent Labs    09/13/19 1331  FERRITIN 392*   Sepsis Labs: Recent Labs  Lab 09/11/19 1810 09/13/19 1331 09/13/19 1524  PROCALCITON  --  <0.10  --   WBC 5.7 7.0  --   LATICACIDVEN  --  0.9 0.8   Microbiology Recent Results (from the past 240 hour(s))  SARS CORONAVIRUS 2 (TAT 6-24 HRS) Nasopharyngeal Nasopharyngeal Swab     Status: Abnormal   Collection Time: 09/13/19  1:31 PM   Specimen: Nasopharyngeal Swab  Result Value Ref Range Status   SARS Coronavirus 2 POSITIVE (A) NEGATIVE Final    Comment: RESULT CALLED TO, READ BACK BY AND VERIFIED WITH: M. POINDEXTER,RN 0239 09/14/2019 T. TYSOR (NOTE) SARS-CoV-2 target nucleic acids are DETECTED. The SARS-CoV-2 RNA is generally  detectable in upper and lower respiratory specimens during the acute phase of infection. Positive results are indicative of active infection with SARS-CoV-2. Clinical  correlation with patient history and other diagnostic information is necessary to determine patient infection status. Positive results do  not rule out bacterial infection or co-infection with other viruses. The expected result is Negative. Fact Sheet for Patients: SugarRoll.be Fact Sheet for Healthcare Providers: https://www.woods-mathews.com/ This test is not yet approved or cleared by the [place] FDA and  has been authorized for detection and/or diagnosis of SARS-CoV-2 by FDA under an Emergency Use Authorization (EUA). This EUA will remain  in effect (meaning this test can be used)  for the duration of the COVID-19  declaration under Section 564(b)(1) of the Act, 21 U.S.C. section 360bbb-3(b)(1), unless the authorization is terminated or revoked sooner. Performed at [facility], [place] [address] [place], [place] [address]   Blood Culture (routine x 2)     Status: None (Preliminary result)   Collection Time: 09/13/19  1:31 PM   Specimen: Vein; Blood  Result Value Ref Range Status   Specimen Description   Final    SITE NOT SPECIFIED BOTTLES DRAWN AEROBIC AND ANAEROBIC   Special Requests   Final    Blood Culture adequate volume Performed at [facility], [address], [place], [place] [address]    Culture PENDING  Incomplete   Report Status PENDING  Incomplete  Blood Culture (routine x 2)     Status: None (Preliminary result)   Collection Time: 09/13/19  1:54 PM   Specimen: Vein; Blood  Result Value Ref Range Status   Specimen Description   Final    RIGHT ANTECUBITAL BOTTLES DRAWN AEROBIC AND ANAEROBIC   Special Requests   Final    Blood Culture adequate volume Performed at [facility], [address] [place], [place] [address]    Culture PENDING   Incomplete   Report Status PENDING  Incomplete     Medications:   . buPROPion  150 mg Oral q morning - 10a  . enoxaparin (LOVENOX) injection  40 mg Subcutaneous Q24H  . multivitamin with minerals  1 tablet Oral Daily  . sodium chloride flush  3 mL Intravenous Q12H   Continuous Infusions: . sodium chloride 250 mL (09/14/19 0712)  . [START ON 09/15/2019] remdesivir 100 mg in NS 250 mL        LOS: 1 day   Charlynne Cousins  [facility]  09/14/2019, 7:47 AM

## 2019-09-14 NOTE — Progress Notes (Signed)
Arrived from [facility] via ambulance. Resp even and unlabored at rest however labored with exertion. Patient alert and oriented. O2@2L  via N/C. Pt has phone and phone charger and clothing.

## 2019-09-14 NOTE — Progress Notes (Signed)
Pharmacy Note - Remdesivir Dosing  O:  ALT: 33  CXR:  Worsening bilateral peripheral consolidation which may be secondary to atypical viral infection or pneumonia  Requiring supplemental O2:  2L per [place]   A/P:  Patient meets criteria for remdesivir.  Begin remdesivir 200 mg IV x 1, followed by 100 mg IV daily x 4 days  Monitor ALT, clinical progress  Despina Pole, Pharm. D. Clinical Pharmacist 09/14/2019 3:26 AM

## 2019-09-15 LAB — D-DIMER, QUANTITATIVE: D-Dimer, Quant: 0.53 ug/mL-FEU — ABNORMAL HIGH (ref 0.00–0.50)

## 2019-09-15 LAB — C-REACTIVE PROTEIN: CRP: 7.2 mg/dL — ABNORMAL HIGH (ref <1.0)

## 2019-09-15 LAB — SAMPLE TO BLOOD BANK

## 2019-09-15 MED ORDER — ZOLPIDEM TARTRATE 5 MG PO TABS
5.0000 mg | ORAL_TABLET | Freq: Every evening | ORAL | Status: DC | PRN
  Administered 2019-09-15 – 2019-09-17 (×3): 5 mg via ORAL
  Filled 2019-09-15 (×3): qty 1

## 2019-09-15 NOTE — Progress Notes (Signed)
Pt updated family on POC while I was in the room

## 2019-09-15 NOTE — Progress Notes (Signed)
Pt called out re: SOB.  O2 sat 83% on RA.  Pt states she had a coughing fit and then could not find her oxygen.  Placed her on [facility] with return to 88% very quickly.  Currently 92% on 2LNC and no continued distress noted.  Pt no longer endorses SOB.

## 2019-09-15 NOTE — Plan of Care (Signed)
Weaned O2 down to 1L today.  Pt remains mostly independent.  One episode of diarrhea reported by patient.  Otherwise, VSS.    Problem: Education: Goal: Knowledge of risk factors and measures for prevention of condition will improve Outcome: Progressing   Problem: Coping: Goal: Psychosocial and spiritual needs will be supported Outcome: Progressing   Problem: Respiratory: Goal: Will maintain a patent airway Outcome: Progressing Goal: Complications related to the disease process, condition or treatment will be avoided or minimized Outcome: Progressing   Problem: Education: Goal: Knowledge of General Education information will improve Description: Including pain rating scale, medication(s)/side effects and non-pharmacologic comfort measures Outcome: Progressing   Problem: Health Behavior/Discharge Planning: Goal: Ability to manage health-related needs will improve Outcome: Progressing   Problem: Clinical Measurements: Goal: Ability to maintain clinical measurements within normal limits will improve Outcome: Progressing Goal: Will remain free from infection Outcome: Progressing Goal: Diagnostic test results will improve Outcome: Progressing Goal: Respiratory complications will improve Outcome: Progressing Goal: Cardiovascular complication will be avoided Outcome: Progressing   Problem: Activity: Goal: Risk for activity intolerance will decrease Outcome: Progressing   Problem: Nutrition: Goal: Adequate nutrition will be maintained Outcome: Progressing   Problem: Coping: Goal: Level of anxiety will decrease Outcome: Progressing   Problem: Elimination: Goal: Will not experience complications related to bowel motility Outcome: Progressing Goal: Will not experience complications related to urinary retention Outcome: Progressing   Problem: Pain Managment: Goal: General experience of comfort will improve Outcome: Progressing   Problem: Safety: Goal: Ability to remain  free from injury will improve Outcome: Progressing   Problem: Skin Integrity: Goal: Risk for impaired skin integrity will decrease Outcome: Progressing

## 2019-09-15 NOTE — Progress Notes (Signed)
[facility] PROGRESS NOTE    Progress Note  Sarah Booth  [id number] DOB: 24-Sep-1961 DOA: 09/13/2019 PCP: Chesley Noon, MD     Brief Narrative:   Sarah Booth is an 58 y.o. female past medical history significant for migraines at a recent Covid 19+ test at the health department on 09/08/2019.  Has been having progressive shortness of breath along with intermittent fevers, she presented to the ED at [facility] on Thursday with generalized ache, diarrhea nonproductive cough and shortness of breath condition was stable at that time she was discharged home comes back on 09/13/2019, was found to be hypoxic was placed on 2 L of nasal cannula with improvement in his saturations.  Assessment/Plan:   Acute hypoxemic respiratory failure due to Pneumonia due to COVID-19 virus She is currently satting greater than 96% on 2 L of oxygen. We will try to wean off oxygen ambulate and check saturations. Continue IV remdesivir and steroids, try to keep the patient peripherally 16 hours a day. Vitamin C and zinc, continue to trend inflammatory markers..  Hypokalemia: Repleted orally recheck in the morning.  Diarrhea due to COVID-19: She relates her diarrhea is improving   DVT prophylaxis: lovenox Family Communication:none Disposition Plan/Barrier to D/C: Home when she complete her remdesivir treatment. Code Status:     Code Status Orders  (From admission, onward)         Start     Ordered   09/13/19 1547  Full code  Continuous     09/13/19 1548        Code Status History    This patient has a current code status but no historical code status.   Advance Care Planning Activity        IV Access:    Peripheral IV   Procedures and diagnostic studies:   Dg Chest Port 1 View  Result Date: 09/13/2019 CLINICAL DATA:  Patient with recent diagnosis of COVID-19. EXAM: PORTABLE CHEST 1 VIEW COMPARISON:  Chest radiograph 09/11/2019. FINDINGS: Monitoring leads overlie  the patient. Stable enlarged cardiac and mediastinal contours. Interval worsening bilateral predominately mid and lower lung patchy areas of consolidation. No pleural effusion or pneumothorax. IMPRESSION: Worsening bilateral peripheral consolidation which may be secondary to atypical viral infection or pneumonia. Electronically Signed   By: Lovey Newcomer M.D.   On: 09/13/2019 14:16     Medical Consultants:    None.  Anti-Infectives:   IV remdesivir  Subjective:    Sarah Booth she relates she has more energy today,, she also relates her shortness of breath is improved compared to yesterday.  Objective:    Vitals:   09/14/19 1537 09/14/19 1910 09/14/19 2025 09/15/19 0415  BP: 126/79  (!) 141/79 134/61  Pulse: 96 88 81 80  Resp: 18  18 18   Temp: 99.2 F (37.3 C)  98.7 F (37.1 C) 98.7 F (37.1 C)  TempSrc: Oral  Oral Oral  SpO2: 92% 97% 99% 98%  Weight:      Height:       SpO2: 98 % O2 Flow Rate (L/min): 2 L/min   Intake/Output Summary (Last 24 hours) at 09/15/2019 0809 Last data filed at 09/14/2019 1817 Gross per 24 hour  Intake 360 ml  Output -  Net 360 ml   Filed Weights   09/13/19 1309  Weight: 73.5 kg    Exam: General exam: In no acute distress. Respiratory system: Good air movement and diffuse crackles bilaterally. Cardiovascular system: S1 & S2 heard, RRR. No JVD. Gastrointestinal  system: Abdomen is nondistended, soft and nontender.  Central nervous system: Alert and oriented. No focal neurological deficits. Extremities: No pedal edema. Skin: No rashes, lesions or ulcers Psychiatry: Judgement and insight appear normal. Mood & affect appropriate.    Data Reviewed:    Labs: Basic Metabolic Panel: Recent Labs  Lab 09/11/19 1810 09/13/19 1331  NA 137 136  K 3.7 3.4*  CL 102 102  CO2 24 23  GLUCOSE 98 114*  BUN 15 16  CREATININE 1.26* 1.07*  CALCIUM 8.1* 8.0*   GFR Estimated Creatinine Clearance: 54.9 mL/min (A) (by C-G formula based on  SCr of 1.07 mg/dL (H)). Liver Function Tests: Recent Labs  Lab 09/13/19 1331  AST 41  ALT 33  ALKPHOS 67  BILITOT 0.8  PROT 8.0  ALBUMIN 3.3*   No results for input(s): LIPASE, AMYLASE in the last 168 hours. No results for input(s): AMMONIA in the last 168 hours. Coagulation profile No results for input(s): INR, PROTIME in the last 168 hours. COVID-19 Labs  Recent Labs    09/13/19 1331  DDIMER 0.54*  FERRITIN 392*  LDH 288*  CRP 14.6*    Lab Results  Component Value Date   SARSCOV2NAA POSITIVE (A) 09/13/2019   [place] Not Detected 08/28/2019    CBC: Recent Labs  Lab 09/11/19 1810 09/13/19 1331  WBC 5.7 7.0  NEUTROABS 4.7 5.9  HGB 12.2 11.7*  HCT 38.0 36.0  MCV 93.6 91.1  PLT 179 231   Cardiac Enzymes: No results for input(s): CKTOTAL, CKMB, CKMBINDEX, TROPONINI in the last 168 hours. BNP (last 3 results) No results for input(s): PROBNP in the last 8760 hours. CBG: No results for input(s): GLUCAP in the last 168 hours. D-Dimer: Recent Labs    09/13/19 1331  DDIMER 0.54*   Hgb A1c: No results for input(s): HGBA1C in the last 72 hours. Lipid Profile: Recent Labs    09/13/19 1331  TRIG 249*   Thyroid function studies: No results for input(s): TSH, T4TOTAL, T3FREE, THYROIDAB in the last 72 hours.  Invalid input(s): FREET3 Anemia work up: Recent Labs    09/13/19 1331  FERRITIN 392*   Sepsis Labs: Recent Labs  Lab 09/11/19 1810 09/13/19 1331 09/13/19 1524  PROCALCITON  --  <0.10  --   WBC 5.7 7.0  --   LATICACIDVEN  --  0.9 0.8   Microbiology Recent Results (from the past 240 hour(s))  SARS CORONAVIRUS 2 (TAT 6-24 HRS) Nasopharyngeal Nasopharyngeal Swab     Status: Abnormal   Collection Time: 09/13/19  1:31 PM   Specimen: Nasopharyngeal Swab  Result Value Ref Range Status   SARS Coronavirus 2 POSITIVE (A) NEGATIVE Final    Comment: RESULT CALLED TO, READ BACK BY AND VERIFIED WITH: M. POINDEXTER,RN 0239 09/14/2019 T. TYSOR  (NOTE) SARS-CoV-2 target nucleic acids are DETECTED. The SARS-CoV-2 RNA is generally detectable in upper and lower respiratory specimens during the acute phase of infection. Positive results are indicative of active infection with SARS-CoV-2. Clinical  correlation with patient history and other diagnostic information is necessary to determine patient infection status. Positive results do  not rule out bacterial infection or co-infection with other viruses. The expected result is Negative. Fact Sheet for Patients: SugarRoll.be Fact Sheet for Healthcare Providers: https://www.woods-mathews.com/ This test is not yet approved or cleared by the [place] FDA and  has been authorized for detection and/or diagnosis of SARS-CoV-2 by FDA under an Emergency Use Authorization (EUA). This EUA will remain  in effect (meaning this test  can be used)  for the duration of the COVID-19 declaration under Section 564(b)(1) of the Act, 21 U.S.C. section 360bbb-3(b)(1), unless the authorization is terminated or revoked sooner. Performed at [facility], [place] [address], [place],  [place] [address]   Blood Culture (routine x 2)     Status: None (Preliminary result)   Collection Time: 09/13/19  1:31 PM   Specimen: Vein; Blood  Result Value Ref Range Status   Specimen Description   Final    SITE NOT SPECIFIED BOTTLES DRAWN AEROBIC AND ANAEROBIC   Special Requests   Final    Blood Culture adequate volume Performed at [facility], [address], [place], [place] [address]    Culture PENDING  Incomplete   Report Status PENDING  Incomplete  Blood Culture (routine x 2)     Status: None (Preliminary result)   Collection Time: 09/13/19  1:54 PM   Specimen: Vein; Blood  Result Value Ref Range Status   Specimen Description   Final    RIGHT ANTECUBITAL BOTTLES DRAWN AEROBIC AND ANAEROBIC   Special Requests   Final    Blood Culture adequate volume Performed  at [facility], [address], [place], [place] [address]    Culture PENDING  Incomplete   Report Status PENDING  Incomplete     Medications:   . buPROPion  150 mg Oral q morning - 10a  . dexamethasone  6 mg Oral Q12H  . enoxaparin (LOVENOX) injection  40 mg Subcutaneous Q24H  . multivitamin with minerals  1 tablet Oral Daily  . sodium chloride flush  3 mL Intravenous Q12H   Continuous Infusions: . sodium chloride Stopped (09/14/19 1516)  . remdesivir 100 mg in NS 250 mL        LOS: 2 days   Charlynne Cousins  [facility]  09/15/2019, 8:09 AM

## 2019-09-16 LAB — BASIC METABOLIC PANEL
Anion gap: 11 (ref 5–15)
BUN: 22 mg/dL — ABNORMAL HIGH (ref 6–20)
CO2: 23 mmol/L (ref 22–32)
Calcium: 8.3 mg/dL — ABNORMAL LOW (ref 8.9–10.3)
Chloride: 104 mmol/L (ref 98–111)
Creatinine, Ser: 0.98 mg/dL (ref 0.44–1.00)
GFR calc Af Amer: >60 mL/min (ref >60)
GFR calc non Af Amer: >60 mL/min (ref >60)
Glucose, Bld: 149 mg/dL — ABNORMAL HIGH (ref 70–99)
Potassium: 4.2 mmol/L (ref 3.5–5.1)
Sodium: 138 mmol/L (ref 135–145)

## 2019-09-16 LAB — C-REACTIVE PROTEIN: CRP: 4.5 mg/dL — ABNORMAL HIGH (ref <1.0)

## 2019-09-16 LAB — D-DIMER, QUANTITATIVE: D-Dimer, Quant: 0.48 ug/mL-FEU (ref 0.00–0.50)

## 2019-09-16 MED ORDER — POTASSIUM CHLORIDE CRYS ER 20 MEQ PO TBCR
20.0000 meq | EXTENDED_RELEASE_TABLET | Freq: Two times a day (BID) | ORAL | Status: DC
  Administered 2019-09-16 – 2019-09-18 (×5): 20 meq via ORAL
  Filled 2019-09-16 (×4): qty 1

## 2019-09-16 NOTE — Evaluation (Signed)
Physical Therapy Evaluation Patient Details Name: Sarah Booth MRN: [id number] DOB: 1961/08/05 Today's Date: 09/16/2019   History of Present Illness  Sarah Booth is an 58 y.o. female past medical history significant for migraines, positivet Covid 19+ test at the health department on 09/08/2019, comes with progressive shortness of breath , intermittent fevers, to the ED at [facility] 11/5  with generalized ache, diarrhea nonproductive cough and shortness of breath, was discharged home comes back on 09/13/2019, was found to be hypoxic was placed on 2 L of nasal cannula with improvement in his saturations  Clinical Impression  The patient is on 2 L [place], monitored on finger with peds probe, SPO2 92-95% at rest. Ambulated x 20' on RA with SPO2 desaturation to 79%, encouraged Pursed Lip Breaths., noted very SOB, HR 125, RR 25. After resting, SPO2 returned to 90%, HR 111. Patient  Will benefit from PT for progressive activity while monitoring SPO2 for home needs. Pt admitted with above diagnosis. Pt currently with functional limitations due to the deficits listed below (see PT Problem List). Pt will benefit from skilled PT to increase their independence and safety with mobility to allow discharge to the venue listed below.       Follow Up Recommendations No PT follow up    Equipment Recommendations  None recommended by PT    Recommendations for Other Services OT consult     Precautions / Restrictions Precautions Precaution Comments: desats, on 2 L, tried ear vs finger, not good pleth either. Did not bump up L.     Mobility  Bed Mobility Overal bed mobility: Independent                Transfers Overall transfer level: Modified independent               General transfer comment: geting to [facility] and back to bed, has amb to BR but RN had pt. to stop due to weakness and SOB  Ambulation/Gait Ambulation/Gait assistance: Min guard Gait Distance (Feet): 20 Feet Assistive device:  Rolling walker (2 wheeled) Gait Pattern/deviations: Step-through pattern;Decreased stride length     General Gait Details: very slow speed, encouraged PLB  Stairs            Wheelchair Mobility    Modified Rankin (Stroke Patients Only)       Balance Overall balance assessment: No apparent balance deficits (not formally assessed)                                           Pertinent Vitals/Pain      Home Living Family/patient expects to be discharged to:: Private residence Living Arrangements: Spouse/significant other;Children Available Help at Discharge: Family Type of Home: House Home Access: Stairs to enter   Technical brewer of Steps: 3 Home Layout: One level Home Equipment: Shower seat      Prior Function Level of Independence: Independent               Hand Dominance        Extremity/Trunk Assessment   Upper Extremity Assessment Upper Extremity Assessment: Generalized weakness    Lower Extremity Assessment Lower Extremity Assessment: Generalized weakness    Cervical / Trunk Assessment Cervical / Trunk Assessment: Normal  Communication   Communication: No difficulties  Cognition Arousal/Alertness: Awake/alert Behavior During Therapy: WFL for tasks assessed/performed Overall Cognitive Status: Within Functional Limits for tasks assessed  General Comments      Exercises Other Exercises Other Exercises: level 2 TB UE exercises: shoulder abd, elb ext x 10 Other Exercises: pursed lip breaths   Assessment/Plan    PT Assessment Patient needs continued PT services  PT Problem List Decreased strength;Decreased mobility;Decreased knowledge of precautions;Decreased activity tolerance;Cardiopulmonary status limiting activity       PT Treatment Interventions Gait training;Stair training;Functional mobility training;Therapeutic exercise;Therapeutic  activities;Patient/family education    PT Goals (Current goals can be found in the Care Plan section)  Acute Rehab PT Goals Patient Stated Goal: to be over this PT Goal Formulation: With patient Time For Goal Achievement: 09/30/19 Potential to Achieve Goals: Good    Frequency Min 3X/week   Barriers to discharge        Co-evaluation               AM-PAC PT "6 Clicks" Mobility  Outcome Measure Help needed turning from your back to your side while in a flat bed without using bedrails?: None Help needed moving from lying on your back to sitting on the side of a flat bed without using bedrails?: None Help needed moving to and from a bed to a chair (including a wheelchair)?: None Help needed standing up from a chair using your arms (e.g., wheelchair or bedside chair)?: None Help needed to walk in hospital room?: A Little Help needed climbing 3-5 steps with a railing? : A Lot 6 Click Score: 21    End of Session Equipment Utilized During Treatment: Oxygen Activity Tolerance: Treatment limited secondary to medical complications (Comment) Patient left: in bed Nurse Communication: Mobility status PT Visit Diagnosis: Difficulty in walking, not elsewhere classified (R26.2)    Time: 1410-1457 PT Time Calculation (min) (ACUTE ONLY): 47 min   Charges:   PT Evaluation $PT Eval Moderate Complexity: 1 Mod PT Treatments $Gait Training: 23-37 mins        [place]  Office [phone]  Claretha Cooper 09/16/2019, 4:02 PM

## 2019-09-16 NOTE — Progress Notes (Signed)
[facility] PROGRESS NOTE    Progress Note  Sarah Booth  [id number] DOB: 06-09-1961 DOA: 09/13/2019 PCP: Chesley Noon, MD     Brief Narrative:   Sarah Booth is an 58 y.o. female past medical history significant for migraines at a recent Covid 19+ test at the health department on 09/08/2019.  Has been having progressive shortness of breath along with intermittent fevers, she presented to the ED at [facility] on Thursday with generalized ache, diarrhea nonproductive cough and shortness of breath condition was stable at that time she was discharged home comes back on 09/13/2019, was found to be hypoxic was placed on 2 L of nasal cannula with improvement in his saturations.  Assessment/Plan:   Acute hypoxemic respiratory failure due to Pneumonia due to COVID-19 virus Patient is currently saturating greater 95% on 1 L of oxygen, will try to wean to room air. Desaturates on room air to 83% with 2 L of oxygen he improved to 92% quickly. Due to IV remdesivir and steroids, try to keep the patient peripherally 16 hours a day, continue vitamin C and zinc. Continue to follow inflammatory markers.  Hypokalemia: Panel is pending this morning.  Likely due to diarrhea.  Diarrhea due to COVID-19: She relates her diarrhea is improving   DVT prophylaxis: lovenox Family Communication:none Disposition Plan/Barrier to D/C: Home when she complete her remdesivir treatment. Code Status:     Code Status Orders  (From admission, onward)         Start     Ordered   09/13/19 1547  Full code  Continuous     09/13/19 1548        Code Status History    This patient has a current code status but no historical code status.   Advance Care Planning Activity        IV Access:    Peripheral IV   Procedures and diagnostic studies:   No results found.   Medical Consultants:    None.  Anti-Infectives:   IV remdesivir  Subjective:    Sarah Booth she relates  her energy has returned today her shortness of breath is also improved.  Objective:    Vitals:   09/15/19 1915 09/15/19 2010 09/16/19 0440 09/16/19 0735  BP:  (!) 155/93 (!) 151/73 (!) 144/77  Pulse: 94 90 80 87  Resp:  20 20 16   Temp:  99 F (37.2 C) 98.3 F (36.8 C) 98.2 F (36.8 C)  TempSrc:  Oral Oral Oral  SpO2: 91% 91% 95% 95%  Weight:      Height:       SpO2: 95 % O2 Flow Rate (L/min): 1 L/min   Intake/Output Summary (Last 24 hours) at 09/16/2019 0800 Last data filed at 09/15/2019 1800 Gross per 24 hour  Intake 1090 ml  Output -  Net 1090 ml   Filed Weights   09/13/19 1309  Weight: 73.5 kg    Exam: General exam: In no acute distress. Respiratory system: Good air movement and clear to auscultation. Cardiovascular system: S1 & S2 heard, RRR. No JVD. Gastrointestinal system: Abdomen is nondistended, soft and nontender.  Central nervous system: Alert and oriented. No focal neurological deficits. Extremities: No pedal edema. Skin: No rashes, lesions or ulcers Psychiatry: Judgement and insight appear normal. Mood & affect appropriate.    Data Reviewed:    Labs: Basic Metabolic Panel: Recent Labs  Lab 09/11/19 1810 09/13/19 1331  NA 137 136  K 3.7 3.4*  CL 102 102  CO2 24 23  GLUCOSE 98 114*  BUN 15 16  CREATININE 1.26* 1.07*  CALCIUM 8.1* 8.0*   GFR Estimated Creatinine Clearance: 54.9 mL/min (A) (by C-G formula based on SCr of 1.07 mg/dL (H)). Liver Function Tests: Recent Labs  Lab 09/13/19 1331  AST 41  ALT 33  ALKPHOS 67  BILITOT 0.8  PROT 8.0  ALBUMIN 3.3*   No results for input(s): LIPASE, AMYLASE in the last 168 hours. No results for input(s): AMMONIA in the last 168 hours. Coagulation profile No results for input(s): INR, PROTIME in the last 168 hours. COVID-19 Labs  Recent Labs    09/13/19 1331 09/15/19 1036 09/16/19 0404  DDIMER 0.54* 0.53* 0.48  FERRITIN 392*  --   --   LDH 288*  --   --   CRP 14.6* 7.2* 4.5*     Lab Results  Component Value Date   SARSCOV2NAA POSITIVE (A) 09/13/2019   [place] Not Detected 08/28/2019    CBC: Recent Labs  Lab 09/11/19 1810 09/13/19 1331  WBC 5.7 7.0  NEUTROABS 4.7 5.9  HGB 12.2 11.7*  HCT 38.0 36.0  MCV 93.6 91.1  PLT 179 231   Cardiac Enzymes: No results for input(s): CKTOTAL, CKMB, CKMBINDEX, TROPONINI in the last 168 hours. BNP (last 3 results) No results for input(s): PROBNP in the last 8760 hours. CBG: No results for input(s): GLUCAP in the last 168 hours. D-Dimer: Recent Labs    09/15/19 1036 09/16/19 0404  DDIMER 0.53* 0.48   Hgb A1c: No results for input(s): HGBA1C in the last 72 hours. Lipid Profile: Recent Labs    09/13/19 1331  TRIG 249*   Thyroid function studies: No results for input(s): TSH, T4TOTAL, T3FREE, THYROIDAB in the last 72 hours.  Invalid input(s): FREET3 Anemia work up: Recent Labs    09/13/19 1331  FERRITIN 392*   Sepsis Labs: Recent Labs  Lab 09/11/19 1810 09/13/19 1331 09/13/19 1524  PROCALCITON  --  <0.10  --   WBC 5.7 7.0  --   LATICACIDVEN  --  0.9 0.8   Microbiology Recent Results (from the past 240 hour(s))  SARS CORONAVIRUS 2 (TAT 6-24 HRS) Nasopharyngeal Nasopharyngeal Swab     Status: Abnormal   Collection Time: 09/13/19  1:31 PM   Specimen: Nasopharyngeal Swab  Result Value Ref Range Status   SARS Coronavirus 2 POSITIVE (A) NEGATIVE Final    Comment: RESULT CALLED TO, READ BACK BY AND VERIFIED WITH: M. POINDEXTER,RN 0239 09/14/2019 T. TYSOR (NOTE) SARS-CoV-2 target nucleic acids are DETECTED. The SARS-CoV-2 RNA is generally detectable in upper and lower respiratory specimens during the acute phase of infection. Positive results are indicative of active infection with SARS-CoV-2. Clinical  correlation with patient history and other diagnostic information is necessary to determine patient infection status. Positive results do  not rule out bacterial infection or co-infection with  other viruses. The expected result is Negative. Fact Sheet for Patients: SugarRoll.be Fact Sheet for Healthcare Providers: https://www.woods-mathews.com/ This test is not yet approved or cleared by the [place] FDA and  has been authorized for detection and/or diagnosis of SARS-CoV-2 by FDA under an Emergency Use Authorization (EUA). This EUA will remain  in effect (meaning this test can be used)  for the duration of the COVID-19 declaration under Section 564(b)(1) of the Act, 21 U.S.C. section 360bbb-3(b)(1), unless the authorization is terminated or revoked sooner. Performed at [facility], [place] [address] [place], [place] [address]   Blood Culture (routine x  2)     Status: None (Preliminary result)   Collection Time: 09/13/19  1:31 PM   Specimen: Site Not Specified; Blood  Result Value Ref Range Status   Specimen Description   Final    SITE NOT SPECIFIED BOTTLES DRAWN AEROBIC AND ANAEROBIC   Special Requests Blood Culture adequate volume  Final   Culture   Final    NO GROWTH 2 DAYS Performed at [facility], [address] [place], [place] [address]    Report Status PENDING  Incomplete  Blood Culture (routine x 2)     Status: None (Preliminary result)   Collection Time: 09/13/19  1:54 PM   Specimen: Right Antecubital; Blood  Result Value Ref Range Status   Specimen Description   Final    RIGHT ANTECUBITAL BOTTLES DRAWN AEROBIC AND ANAEROBIC   Special Requests Blood Culture adequate volume  Final   Culture   Final    NO GROWTH 2 DAYS Performed at [facility], [address] [place], [place] [address]    Report Status PENDING  Incomplete     Medications:   . buPROPion  150 mg Oral q morning - 10a  . dexamethasone  6 mg Oral Q12H  . enoxaparin (LOVENOX) injection  40 mg Subcutaneous Q24H  . multivitamin with minerals  1 tablet Oral Daily  . sodium chloride flush  3 mL Intravenous Q12H   Continuous Infusions: .  sodium chloride Stopped (09/14/19 1516)  . remdesivir 100 mg in NS 250 mL Stopped (09/15/19 1021)      LOS: 3 days   Charlynne Cousins  [facility]  09/16/2019, 8:00 AM

## 2019-09-17 DIAGNOSIS — A0839 Other viral enteritis: Secondary | ICD-10-CM

## 2019-09-17 LAB — C-REACTIVE PROTEIN: CRP: 2.6 mg/dL — ABNORMAL HIGH (ref <1.0)

## 2019-09-17 LAB — BASIC METABOLIC PANEL
Anion gap: 11 (ref 5–15)
BUN: 22 mg/dL — ABNORMAL HIGH (ref 6–20)
CO2: 23 mmol/L (ref 22–32)
Calcium: 8.4 mg/dL — ABNORMAL LOW (ref 8.9–10.3)
Chloride: 103 mmol/L (ref 98–111)
Creatinine, Ser: 1 mg/dL (ref 0.44–1.00)
GFR calc Af Amer: >60 mL/min (ref >60)
GFR calc non Af Amer: >60 mL/min (ref >60)
Glucose, Bld: 135 mg/dL — ABNORMAL HIGH (ref 70–99)
Potassium: 4.4 mmol/L (ref 3.5–5.1)
Sodium: 137 mmol/L (ref 135–145)

## 2019-09-17 LAB — D-DIMER, QUANTITATIVE: D-Dimer, Quant: 0.48 ug/mL-FEU (ref 0.00–0.50)

## 2019-09-17 NOTE — Progress Notes (Signed)
PROGRESS NOTE  Arisbeth Daube  [id number] DOB: Feb 07, 1961 DOA: 09/13/2019 PCP: Chesley Noon, MD   Brief Narrative: Amiryah Blong is a 58 y.o. female with a history of migraines, depression, and covid-19 diagnosed at local HD 11/2 who presented with dyspnea, body aches found to be hypoxic on 11/7. She was admitted, started on remdesivir and steroids with steady improvement in hypoxia.    Assessment & Plan: Active Problems:   Acute hypoxemic respiratory failure (HCC)   Pneumonia due to COVID-19 virus   Hypokalemia   Diarrhea due to COVID-19  Acute hypoxic respiratory failure due to covid-19 pneumonia: - Complete remdesivir tomorrow, will be eligible for discharge pending ambulatory pulse oximetry.  - Continue steroids.  - Continue airborne, contact precautions. PPE including surgical gown, gloves, cap, shoe covers, and CAPR used during this encounter in a negative pressure room.  - Check daily labs: CBC w/diff, CMP, d-dimer, ferritin, CRP - Recommend proning and aggressive use of incentive spirometry.  History of migraines: Quiescent.  - prn ordered  Depression:  - Continue bupropion.  DVT prophylaxis: Lovenox Code Status: Full Family Communication: None at bedside, did not request a call Disposition Plan: Home once stable following 5 days of remdesivir. 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   Consultants:   None  Procedures:   None  Antimicrobials:  Remdesivir 11/8 - 11/12   Subjective: Feels much better than at admission. No dyspnea at rest, shortness of breath at exertion is moderate, constant, overall improving. Feels she'll be able to get around sufficiently at home with her current exertional capacity. No chest pain.   Objective: Vitals:   09/16/19 1910 09/16/19 1941 09/16/19 2005 09/17/19 0410  BP:  (!) 158/88 (!) 160/96 (!) 146/83  Pulse: 97 92 88 79  Resp:  20 18 20   Temp:   98.5 F (36.9 C) 98.4 F (36.9 C) 98.5 F (36.9 C)  TempSrc:  Oral Oral Oral  SpO2: 96% 95% 91% 96%  Weight:      Height:        Intake/Output Summary (Last 24 hours) at 09/17/2019 1621 Last data filed at 09/17/2019 1300 Gross per 24 hour  Intake 600 ml  Output -  Net 600 ml   Filed Weights   09/13/19 1309  Weight: 73.5 kg    Gen: 58 y.o. female in no distress  Pulm: Non-labored breathing room air at rest. Clear to auscultation bilaterally.  CV: Regular rate and rhythm. No murmur, rub, or gallop. No JVD, no pedal edema. GI: Abdomen soft, non-tender, non-distended, with normoactive bowel sounds. No organomegaly or masses felt. Ext: Warm, no deformities Skin: No rashes, lesions or ulcers Neuro: Alert and oriented. No focal neurological deficits. Psych: Judgement and insight appear normal. Mood & affect appropriate.   Data Reviewed: I have personally reviewed following labs and imaging studies  CBC: Recent Labs  Lab 09/11/19 1810 09/13/19 1331  WBC 5.7 7.0  NEUTROABS 4.7 5.9  HGB 12.2 11.7*  HCT 38.0 36.0  MCV 93.6 91.1  PLT 179 [id number]   Basic Metabolic Panel: Recent Labs  Lab 09/11/19 1810 09/13/19 1331 09/16/19 0404 09/17/19 0215  NA 137 136 138 137  K 3.7 3.4* 4.2 4.4  CL 102 102 104 103  CO2 24 23 23 23   GLUCOSE 98 114* 149* 135*  BUN 15 16 22* 22*  CREATININE 1.26* 1.07* 0.98 1.00  CALCIUM 8.1* 8.0* 8.3* 8.4*  GFR: Estimated Creatinine Clearance: 58.7 mL/min (by C-G formula based on SCr of 1 mg/dL). Liver Function Tests: Recent Labs  Lab 09/13/19 1331  AST 41  ALT 33  ALKPHOS 67  BILITOT 0.8  PROT 8.0  ALBUMIN 3.3*   No results for input(s): LIPASE, AMYLASE in the last 168 hours. No results for input(s): AMMONIA in the last 168 hours. Coagulation Profile: No results for input(s): INR, PROTIME in the last 168 hours. Cardiac Enzymes: No results for input(s): CKTOTAL, CKMB, CKMBINDEX, TROPONINI in the last 168 hours. BNP (last 3 results)  No results for input(s): PROBNP in the last 8760 hours. HbA1C: No results for input(s): HGBA1C in the last 72 hours. CBG: No results for input(s): GLUCAP in the last 168 hours. Lipid Profile: No results for input(s): CHOL, HDL, LDLCALC, TRIG, CHOLHDL, LDLDIRECT in the last 72 hours. Thyroid Function Tests: No results for input(s): TSH, T4TOTAL, FREET4, T3FREE, THYROIDAB in the last 72 hours. Anemia Panel: No results for input(s): VITAMINB12, FOLATE, FERRITIN, TIBC, IRON, RETICCTPCT in the last 72 hours. Urine analysis:    Component Value Date/Time   COLORURINE YELLOW 07/30/2013 0939   APPEARANCEUR CLOUDY (A) 07/30/2013 0939   LABSPEC 1.010 07/30/2013 0939   PHURINE 7.0 07/30/2013 0939   GLUCOSEU NEGATIVE 07/30/2013 0939   HGBUR NEGATIVE 07/30/2013 0939   BILIRUBINUR NEGATIVE 07/30/2013 0939   KETONESUR 15 (A) 07/30/2013 0939   PROTEINUR NEGATIVE 07/30/2013 0939   UROBILINOGEN 0.2 07/30/2013 0939   NITRITE NEGATIVE 07/30/2013 0939   LEUKOCYTESUR TRACE (A) 07/30/2013 0939   Recent Results (from the past 240 hour(s))  SARS CORONAVIRUS 2 (TAT 6-24 HRS) Nasopharyngeal Nasopharyngeal Swab     Status: Abnormal   Collection Time: 09/13/19  1:31 PM   Specimen: Nasopharyngeal Swab  Result Value Ref Range Status   SARS Coronavirus 2 POSITIVE (A) NEGATIVE Final    Comment: RESULT CALLED TO, READ BACK BY AND VERIFIED WITH: M. POINDEXTER,RN 0239 09/14/2019 T. TYSOR (NOTE) SARS-CoV-2 target nucleic acids are DETECTED. The SARS-CoV-2 RNA is generally detectable in upper and lower respiratory specimens during the acute phase of infection. Positive results are indicative of active infection with SARS-CoV-2. Clinical  correlation with patient history and other diagnostic information is necessary to determine patient infection status. Positive results do  not rule out bacterial infection or co-infection with other viruses. The expected result is Negative. Fact Sheet for Patients:  SugarRoll.be Fact Sheet for Healthcare Providers: https://www.woods-mathews.com/ This test is not yet approved or cleared by the [place] FDA and  has been authorized for detection and/or diagnosis of SARS-CoV-2 by FDA under an Emergency Use Authorization (EUA). This EUA will remain  in effect (meaning this test can be used)  for the duration of the COVID-19 declaration under Section 564(b)(1) of the Act, 21 U.S.C. section 360bbb-3(b)(1), unless the authorization is terminated or revoked sooner. Performed at [facility], [place] [address] [place], [place] [address]   Blood Culture (routine x 2)     Status: None (Preliminary result)   Collection Time: 09/13/19  1:31 PM   Specimen: Site Not Specified; Blood  Result Value Ref Range Status   Specimen Description   Final    SITE NOT SPECIFIED BOTTLES DRAWN AEROBIC AND ANAEROBIC   Special Requests Blood Culture adequate volume  Final   Culture   Final    NO GROWTH 4 DAYS Performed at [facility], [address], [place], [place] [address]    Report Status PENDING  Incomplete  Blood  Culture (routine x 2)     Status: None (Preliminary result)   Collection Time: 09/13/19  1:54 PM   Specimen: Right Antecubital; Blood  Result Value Ref Range Status   Specimen Description   Final    RIGHT ANTECUBITAL BOTTLES DRAWN AEROBIC AND ANAEROBIC   Special Requests Blood Culture adequate volume  Final   Culture   Final    NO GROWTH 4 DAYS Performed at [facility], [address] [place], [place] [address]    Report Status PENDING  Incomplete      Radiology Studies: No results found.  Scheduled Meds: . buPROPion  150 mg Oral q morning - 10a  . dexamethasone  6 mg Oral Q12H  . enoxaparin (LOVENOX) injection  40 mg Subcutaneous Q24H  . multivitamin with minerals  1 tablet Oral Daily  . potassium chloride  20 mEq Oral BID  . sodium chloride flush  3 mL Intravenous Q12H   Continuous  Infusions: . sodium chloride Stopped (09/14/19 1516)  . remdesivir 100 mg in NS 250 mL 100 mg (09/17/19 0918)     LOS: 4 days   Time spent: 25 minutes.  Patrecia Pour, MD [facility] [email or web address] 09/17/2019, 4:21 PM

## 2019-09-17 NOTE — Progress Notes (Signed)
Occupational Therapy Evaluation Patient Details Name: Sarah Booth MRN: [id number] DOB: 1961/02/27 Today's Date: 09/17/2019    History of Present Illness Sarah Booth is an 58 y.o. female past medical history significant for migraines, positivet Covid 19+ test at the health department on 09/08/2019, comes with progressive shortness of breath , intermittent fevers, to the ED at [facility] 11/5  with generalized ache, diarrhea nonproductive cough and shortness of breath, was discharged home comes back on 09/13/2019, was found to be hypoxic was placed on 2 L of nasal cannula with improvement in his saturations   Clinical Impression   PTA, pt independent and worked as a CMA with terminally ill patients. Session focused on anxiety management and teaching pt diaphragmatic breathing techniques in addition to use of flutter valve to help manage SOB during activity.Began education on energy conservation and activity modification.  Pt utilized visualization, deep breathing and progressive muscle relaxation techniques. With use of learned techniques and activity modification, pt able to sustain SpO2 in 90s ( R earlobe probe) on 2-3L with ambulation to bathroom. Will follow acutely. Do not anticipate OT needs after DC. Pt very appreciative.     Follow Up Recommendations  No OT follow up;Supervision - Intermittent    Equipment Recommendations  3 in 1 bedside commode    Recommendations for Other Services       Precautions / Restrictions Precautions Precautions: None      Mobility Bed Mobility Overal bed mobility: Independent                Transfers Overall transfer level: Modified independent Equipment used: Rolling walker (2 wheeled)                  Balance                                           ADL either performed or assessed with clinical judgement   ADL Overall ADL's : Needs assistance/impaired                                      Functional mobility during ADLs: Modified independent General ADL Comments: overall set up. Began education on energy conservation and activity modification. Pt anxious with activity. Session focused on relaxation techniques and use of flutter valve before and during activity to help with SOB. Good return demosntration.      Vision         Perception     Praxis      Pertinent Vitals/Pain Pain Assessment: Faces Faces Pain Scale: Hurts a little bit Pain Location: chest disomcfort with SOB Pain Descriptors / Indicators: Discomfort Pain Intervention(s): Limited activity within patient's tolerance     Hand Dominance Right   Extremity/Trunk Assessment Upper Extremity Assessment Upper Extremity Assessment: Generalized weakness   Lower Extremity Assessment Lower Extremity Assessment: Defer to PT evaluation   Cervical / Trunk Assessment Cervical / Trunk Assessment: Normal   Communication Communication Communication: No difficulties   Cognition Arousal/Alertness: Awake/alert Behavior During Therapy: WFL for tasks assessed/performed Overall Cognitive Status: Within Functional Limits for tasks assessed                                     General Comments  Exercises Other Exercises Other Exercises: flutter valve x 10 Other Exercises:  diaphragmatic breathing   Shoulder Instructions      Home Living Family/patient expects to be discharged to:: Private residence Living Arrangements: Spouse/significant other;Children Available Help at Discharge: Family;Available 24 hours/day Type of Home: House Home Access: Stairs to enter [employer] of Steps: 3   Home Layout: One level     Bathroom Shower/Tub: Occupational psychologist: Handicapped height Bathroom Accessibility: Yes How Accessible: Accessible via walker Home Equipment: None          Prior Functioning/Environment Level of Independence: Independent        Comments:  works as a Technical brewer for terminally ill pts        OT Problem List: Decreased activity tolerance;Decreased knowledge of use of DME or AE;Cardiopulmonary status limiting activity      OT Treatment/Interventions: Self-care/ADL training;Therapeutic exercise;Neuromuscular education;DME and/or AE instruction;Energy conservation;Therapeutic activities;Patient/family education    OT Goals(Current goals can be found in the care plan section) Acute Rehab OT Goals Patient Stated Goal: to be able to breath OT Goal Formulation: With patient Time For Goal Achievement: 10/01/19 Potential to Achieve Goals: Good  OT Frequency: Min 3X/week   Barriers to D/C:            Co-evaluation              AM-PAC OT "6 Clicks" Daily Activity     Outcome Measure Help from another person eating meals?: None Help from another person taking care of personal grooming?: A Little Help from another person toileting, which includes using toliet, bedpan, or urinal?: A Little Help from another person bathing (including washing, rinsing, drying)?: A Little Help from another person to put on and taking off regular upper body clothing?: A Little Help from another person to put on and taking off regular lower body clothing?: A Little 6 Click Score: 19   End of Session Equipment Utilized During Treatment: Oxygen(2L rest; 3L while ambulating) Nurse Communication: Mobility status  Activity Tolerance: Patient tolerated treatment well Patient left: in chair;with call bell/phone within reach  OT Visit Diagnosis: Unsteadiness on feet (R26.81);Muscle weakness (generalized) (M62.81)                Time: [id number] OT Time Calculation (min): 46 min Charges:  OT General Charges $OT Visit: 1 Visit OT Evaluation $OT Eval Moderate Complexity: 1 Mod OT Treatments $Self Care/Home Management : 8-22 mins $Therapeutic Activity: 8-22 mins  Maurie Boettcher, OT/L   Acute OT Clinical Specialist Lane Pager  [phone] Office [phone]   [facility] 09/17/2019, 11:49 AM

## 2019-09-17 NOTE — Plan of Care (Signed)
  Problem: Clinical Measurements: Goal: Respiratory complications will improve Outcome: Progressing   

## 2019-09-18 LAB — COMPREHENSIVE METABOLIC PANEL
ALT: 33 U/L (ref 0–44)
AST: 22 U/L (ref 15–41)
Albumin: 3.3 g/dL — ABNORMAL LOW (ref 3.5–5.0)
Alkaline Phosphatase: 66 U/L (ref 38–126)
Anion gap: 12 (ref 5–15)
BUN: 21 mg/dL — ABNORMAL HIGH (ref 6–20)
CO2: 22 mmol/L (ref 22–32)
Calcium: 8.5 mg/dL — ABNORMAL LOW (ref 8.9–10.3)
Chloride: 102 mmol/L (ref 98–111)
Creatinine, Ser: 1.11 mg/dL — ABNORMAL HIGH (ref 0.44–1.00)
GFR calc Af Amer: >60 mL/min (ref >60)
GFR calc non Af Amer: 54 mL/min — ABNORMAL LOW (ref >60)
Glucose, Bld: 185 mg/dL — ABNORMAL HIGH (ref 70–99)
Potassium: 4.4 mmol/L (ref 3.5–5.1)
Sodium: 136 mmol/L (ref 135–145)
Total Bilirubin: 0.8 mg/dL (ref 0.3–1.2)
Total Protein: 7.6 g/dL (ref 6.5–8.1)

## 2019-09-18 LAB — D-DIMER, QUANTITATIVE: D-Dimer, Quant: 0.68 ug/mL-FEU — ABNORMAL HIGH (ref 0.00–0.50)

## 2019-09-18 LAB — CULTURE, BLOOD (ROUTINE X 2)
Culture: NO GROWTH
Culture: NO GROWTH
Special Requests: ADEQUATE
Special Requests: ADEQUATE

## 2019-09-18 LAB — C-REACTIVE PROTEIN: CRP: 1.6 mg/dL — ABNORMAL HIGH (ref <1.0)

## 2019-09-18 MED ORDER — DEXAMETHASONE 6 MG PO TABS: 6.0000 mg | ORAL_TABLET | Freq: Every day | ORAL | 0 refills | Status: AC

## 2019-09-18 NOTE — TOC Transition Note (Signed)
Transition of Care [facility]) - CM/SW Discharge Note   Patient Details  Name: Sarah Booth MRN: [id number] Date of Birth: 06/28/1961  [facility] CM/SW Contact:  Weston Anna, LCSW Phone Number: 09/18/2019, 10:09 AM   Clinical Narrative:     CSW spoke with patient via cell phone regarding oxygen needs- patient prefers [facility] to service o2 needs. Portable tank to be delivered to patients room for transportation and tanks to be delivered to patients address. No other needs at this time- all orders placed by MD   Final next level of care: Home/Self Care Barriers to Discharge: No Barriers Identified   Patient Goals and CMS Choice   CMS Medicare.gov Compare Post Acute Care list provided to:: Patient Choice offered to / list presented to : Patient  Discharge Placement                  Name of family member notified: spoke with patient via cell phone Patient and family notified of of transfer: 09/18/19  Discharge Plan and Services                DME Arranged: Oxygen DME Agency: [place] Date DME Agency Contacted: 09/18/19 Time DME Agency Contacted: 78 Representative spoke with at DME Agency: [facility] HH Arranged: NA          Social Determinants of Health ([place]) Interventions     Readmission Risk Interventions No flowsheet data found.

## 2019-09-18 NOTE — Discharge Summary (Signed)
Physician Discharge Summary  Sarah Booth [id number] DOB: 1961-02-06 DOA: 09/13/2019  PCP: Chesley Noon, MD  Admit date: 09/13/2019 Discharge date: 09/18/2019  Admitted From: Home Disposition: Home   Recommendations for Outpatient Follow-up:  1. Follow up with PCP in 1-2 weeks 2. Continue self isolation for 21 days from positive test on 11/2.   Home Health: None Equipment/Devices: 2L O2 Discharge Condition: Stable CODE STATUS: Full Diet recommendation: Regular  Brief/Interim Summary: Sarah Booth a 58 y.o.femalewith a history of migraines, depression, and covid-19 diagnosed at local HD 11/2 who presented with dyspnea, body aches found to be hypoxic on 11/7. She was admitted, started on remdesivir and steroids with steady improvement in hypoxia. The patient remains hypoxic with ambulation, but with vastly improved respiratory effort and has maximized benefit of hospitalization after 5th dose of remdesivir.  Discharge Diagnoses:  Active Problems:   Acute hypoxemic respiratory failure (HCC)   Pneumonia due to COVID-19 virus   Hypokalemia   Diarrhea due to COVID-19  Acute hypoxic respiratory failure due to covid-19 pneumonia: - Complete remdesivir on day of discharge - Continue supplemental oxygen, anticipate short term need. - Continue steroids, complete 10 days po sent to pharmacy.  - Continue isolation for 21 days from positive test on 11/2. - Recommend proning and aggressive use of incentive spirometry.  History of migraines: Quiescent.  - prn ordered  Depression:  - Continue bupropion.  Discharge Instructions Discharge Instructions    Discharge instructions   Complete by: As directed    You are being discharged from the hospital after treatment for covid-19 infection. You are felt to be stable enough to no longer require inpatient monitoring, testing, and treatment, though you will need to follow the recommendations below: - Continue taking decadron  once daily for 5 more days (start tomorrow morning) - Remain in self-isolation for a total of 21 days from the day you tested positive.  - Do not take NSAID medications (including, but not limited to, ibuprofen, advil, motrin, naproxen, aleve, goody's powder, etc.) - Follow up with your doctor in the next week via telehealth or seek medical attention right away if your symptoms get WORSE.  - Consider donating plasma after you have recovered (either 14 days after a negative test or 28 days after symptoms have completely resolved) because your antibodies to this virus may be helpful to give to others with life-threatening infections. Please go to the website www.[email or web address] if you would like to consider volunteering for plasma donation.    Directions for you at home:  Wear a facemask You should wear a facemask that covers your nose and mouth when you are in the same room with other people and when you visit a healthcare provider. People who live with or visit you should also wear a facemask while they are in the same room with you.  Separate yourself from other people in your home As much as possible, you should stay in a different room from other people in your home. Also, you should use a separate bathroom, if available.  Avoid sharing household items You should not share dishes, drinking glasses, cups, eating utensils, towels, bedding, or other items with other people in your home. After using these items, you should wash them thoroughly with soap and water.  Cover your coughs and sneezes Cover your mouth and nose with a tissue when you cough or sneeze, or you can cough or sneeze into your sleeve. Throw used tissues in a lined trash can, and immediately wash  your hands with soap and water for at least 20 seconds or use an alcohol-based hand rub.  Wash your [facility] your hands often and thoroughly with soap and water for at least 20 seconds. You can use an alcohol-based hand sanitizer  if soap and water are not available and if your hands are not visibly dirty. Avoid touching your eyes, nose, and mouth with unwashed hands.  Directions for those who live with, or provide care at home for you:  Limit the number of people who have contact with the patient If possible, have only one caregiver for the patient. Other household members should stay in another home or place of residence. If this is not possible, they should stay in another room, or be separated from the patient as much as possible. Use a separate bathroom, if available. Restrict visitors who do not have an essential need to be in the home.  Ensure good ventilation Make sure that shared spaces in the home have good air flow, such as from an air conditioner or an opened window, weather permitting.  Wash your hands often Wash your hands often and thoroughly with soap and water for at least 20 seconds. You can use an alcohol based hand sanitizer if soap and water are not available and if your hands are not visibly dirty. Avoid touching your eyes, nose, and mouth with unwashed hands. Use disposable paper towels to dry your hands. If not available, use dedicated cloth towels and replace them when they become wet.  Wear a facemask and gloves Wear a disposable facemask at all times in the room and gloves when you touch or have contact with the patient's blood, body fluids, and/or secretions or excretions, such as sweat, saliva, sputum, nasal mucus, vomit, urine, or feces.  Ensure the mask fits over your nose and mouth tightly, and do not touch it during use. Throw out disposable facemasks and gloves after using them. Do not reuse. Wash your hands immediately after removing your facemask and gloves. If your personal clothing becomes contaminated, carefully remove clothing and launder. Wash your hands after handling contaminated clothing. Place all used disposable facemasks, gloves, and other waste in a lined container  before disposing them with other household waste. Remove gloves and wash your hands immediately after handling these items.  Do not share dishes, glasses, or other household items with the patient Avoid sharing household items. You should not share dishes, drinking glasses, cups, eating utensils, towels, bedding, or other items with a patient who is confirmed to have, or being evaluated for, COVID-19 infection. After the person uses these items, you should wash them thoroughly with soap and water.  Wash laundry thoroughly Immediately remove and wash clothes or bedding that have blood, body fluids, and/or secretions or excretions, such as sweat, saliva, sputum, nasal mucus, vomit, urine, or feces, on them. Wear gloves when handling laundry from the patient. Read and follow directions on labels of laundry or clothing items and detergent. In general, wash and dry with the warmest temperatures recommended on the label.  Clean all areas the individual has used often Clean all touchable surfaces, such as counters, tabletops, doorknobs, bathroom fixtures, toilets, phones, keyboards, tablets, and bedside tables, every day. Also, clean any surfaces that may have blood, body fluids, and/or secretions or excretions on them. Wear gloves when cleaning surfaces the patient has come in contact with. Use a diluted bleach solution (e.g., dilute bleach with 1 part bleach and 10 parts water) or a household disinfectant  with a label that says EPA-registered for coronaviruses. To make a bleach solution at home, add 1 tablespoon of bleach to 1 quart (4 cups) of water. For a larger supply, add  cup of bleach to 1 gallon (16 cups) of water. Read labels of cleaning products and follow recommendations provided on product labels. Labels contain instructions for safe and effective use of the cleaning product including precautions you should take when applying the product, such as wearing gloves or eye protection and making  sure you have good ventilation during use of the product. Remove gloves and wash hands immediately after cleaning.  Monitor yourself for signs and symptoms of illness Caregivers and household members are considered close contacts, should monitor their health, and will be asked to limit movement outside of the home to the extent possible. Follow the monitoring steps for close contacts listed on the symptom monitoring form.  If you have additional questions, contact your local health department or call the epidemiologist on call at [phone] (available 24/7). This guidance is subject to change. For the most up-to-date guidance from [facility], please refer to their website: YouBlogs.pl   MyChart COVID-19 home monitoring program   Complete by: Sep 18, 2019    Is the patient willing to use the [facility] for home monitoring?: Yes   Temperature monitoring   Complete by: Sep 18, 2019    After how many days would you like to receive a notification of this patient's flowsheet entries?: 1     Allergies as of 09/18/2019   No Known Allergies     Medication List    TAKE these medications   albuterol (2.5 MG/3ML) 0.083% nebulizer solution Commonly known as: PROVENTIL Take 3 mLs (2.5 mg dose) by nebulization every 4 (four) hours as needed for Wheezing.   buPROPion 150 MG 24 hr tablet Commonly known as: WELLBUTRIN XL Take 150 mg by mouth every morning.   chlorhexidine 0.12 % solution Commonly known as: PERIDEX Use as directed 15 mLs in the mouth or throat 2 (two) times daily.   dexamethasone 6 MG tablet Commonly known as: DECADRON Take 1 tablet (6 mg total) by mouth daily for 5 days.   multivitamin tablet Take by mouth.   SUMAtriptan 100 MG tablet Commonly known as: IMITREX Take 100 mg by mouth every 2 (two) hours as needed for migraine or headache.            Durable Medical Equipment  (From admission, onward)          Start     Ordered   09/18/19 0757  For home use only DME oxygen  Once    Question Answer Comment  Length of Need 6 Months   Mode or (Route) Nasal cannula   Liters per Minute 2   Frequency Continuous (stationary and portable oxygen unit needed)   Oxygen delivery system Gas      09/18/19 0757         Follow-up Information    Chesley Noon, MD Follow up.   Specialty: Family Medicine Contact information: [place] [address] [phone]          No Known Allergies  Consultations:  None  Procedures/Studies: Dg Chest Port 1 View  Result Date: 09/13/2019 CLINICAL DATA:  Patient with recent diagnosis of COVID-19. EXAM: PORTABLE CHEST 1 VIEW COMPARISON:  Chest radiograph 09/11/2019. FINDINGS: Monitoring leads overlie the patient. Stable enlarged cardiac and mediastinal contours. Interval worsening bilateral predominately mid and lower lung  patchy areas of consolidation. No pleural effusion or pneumothorax. IMPRESSION: Worsening bilateral peripheral consolidation which may be secondary to atypical viral infection or pneumonia. Electronically Signed   By: Lovey Newcomer M.D.   On: 09/13/2019 14:16   Dg Chest Portable 1 View  Result Date: 09/11/2019 CLINICAL DATA:  COVID.  Body aches, shortness of breath EXAM: PORTABLE CHEST 1 VIEW COMPARISON:  None. FINDINGS: Heart is borderline in size. Patchy bibasilar airspace opacities are noted could reflect atelectasis or infiltrates. No visible effusions. No acute bony abnormality. IMPRESSION: Borderline heart size. Bibasilar airspace opacities, atelectasis versus infiltrates/pneumonia. Electronically Signed   By: Rolm Baptise M.D.   On: 09/11/2019 19:11    Subjective: Feels well, walked several times around the unit without chest pain, mild shortness of breath which has continued steady improvement for several days. No chest pain. Wants to go home.  Discharge Exam: Vitals:   09/18/19 0431 09/18/19 0759   BP: 140/84 (!) 140/92  Pulse: 79 86  Resp: 20 20  Temp:  98.1 F (36.7 C)  SpO2: 100% 100%   General: Pt is alert, awake, not in acute distress Cardiovascular: RRR, S1/S2 +, no rubs, no gallops Respiratory: CTA bilaterally, no wheezing, no rhonchi Abdominal: Soft, NT, ND, bowel sounds + Extremities: No edema, no cyanosis  Labs: BNP (last 3 results) No results for input(s): BNP in the last 8760 hours. Basic Metabolic Panel: Recent Labs  Lab 09/11/19 1810 09/13/19 1331 09/16/19 0404 09/17/19 0215 09/18/19 0325  NA 137 136 138 137 136  K 3.7 3.4* 4.2 4.4 4.4  CL 102 102 104 103 102  CO2 24 23 23 23 22   GLUCOSE 98 114* 149* 135* 185*  BUN 15 16 22* 22* 21*  CREATININE 1.26* 1.07* 0.98 1.00 1.11*  CALCIUM 8.1* 8.0* 8.3* 8.4* 8.5*   Liver Function Tests: Recent Labs  Lab 09/13/19 1331 09/18/19 0325  AST 41 22  ALT 33 33  ALKPHOS 67 66  BILITOT 0.8 0.8  PROT 8.0 7.6  ALBUMIN 3.3* 3.3*   No results for input(s): LIPASE, AMYLASE in the last 168 hours. No results for input(s): AMMONIA in the last 168 hours. CBC: Recent Labs  Lab 09/11/19 1810 09/13/19 1331  WBC 5.7 7.0  NEUTROABS 4.7 5.9  HGB 12.2 11.7*  HCT 38.0 36.0  MCV 93.6 91.1  PLT 179 231   Cardiac Enzymes: No results for input(s): CKTOTAL, CKMB, CKMBINDEX, TROPONINI in the last 168 hours. BNP: Invalid input(s): POCBNP CBG: No results for input(s): GLUCAP in the last 168 hours. D-Dimer Recent Labs    09/17/19 0215 09/18/19 0325  DDIMER 0.48 0.68*   Hgb A1c No results for input(s): HGBA1C in the last 72 hours. Lipid Profile No results for input(s): CHOL, HDL, LDLCALC, TRIG, CHOLHDL, LDLDIRECT in the last 72 hours. Thyroid function studies No results for input(s): TSH, T4TOTAL, T3FREE, THYROIDAB in the last 72 hours.  Invalid input(s): FREET3 Anemia work up No results for input(s): VITAMINB12, FOLATE, FERRITIN, TIBC, IRON, RETICCTPCT in the last 72 hours. Urinalysis    Component Value  Date/Time   COLORURINE YELLOW 07/30/2013 0939   APPEARANCEUR CLOUDY (A) 07/30/2013 0939   LABSPEC 1.010 07/30/2013 0939   PHURINE 7.0 07/30/2013 0939   GLUCOSEU NEGATIVE 07/30/2013 0939   HGBUR NEGATIVE 07/30/2013 0939   BILIRUBINUR NEGATIVE 07/30/2013 0939   KETONESUR 15 (A) 07/30/2013 0939   PROTEINUR NEGATIVE 07/30/2013 0939   UROBILINOGEN 0.2 07/30/2013 0939   NITRITE NEGATIVE 07/30/2013 0939   LEUKOCYTESUR TRACE (A)  07/30/2013 [id number]    Microbiology Recent Results (from the past 240 hour(s))  SARS CORONAVIRUS 2 (TAT 6-24 HRS) Nasopharyngeal Nasopharyngeal Swab     Status: Abnormal   Collection Time: 09/13/19  1:31 PM   Specimen: Nasopharyngeal Swab  Result Value Ref Range Status   SARS Coronavirus 2 POSITIVE (A) NEGATIVE Final    Comment: RESULT CALLED TO, READ BACK BY AND VERIFIED WITH: M. POINDEXTER,RN 0239 09/14/2019 T. TYSOR (NOTE) SARS-CoV-2 target nucleic acids are DETECTED. The SARS-CoV-2 RNA is generally detectable in upper and lower respiratory specimens during the acute phase of infection. Positive results are indicative of active infection with SARS-CoV-2. Clinical  correlation with patient history and other diagnostic information is necessary to determine patient infection status. Positive results do  not rule out bacterial infection or co-infection with other viruses. The expected result is Negative. Fact Sheet for Patients: SugarRoll.be Fact Sheet for Healthcare Providers: https://www.woods-mathews.com/ This test is not yet approved or cleared by the [place] FDA and  has been authorized for detection and/or diagnosis of SARS-CoV-2 by FDA under an Emergency Use Authorization (EUA). This EUA will remain  in effect (meaning this test can be used)  for the duration of the COVID-19 declaration under Section 564(b)(1) of the Act, 21 U.S.C. section 360bbb-3(b)(1), unless the authorization is terminated or revoked  sooner. Performed at [facility], [place] [address] [place], [place] [address]   Blood Culture (routine x 2)     Status: None   Collection Time: 09/13/19  1:31 PM   Specimen: Site Not Specified; Blood  Result Value Ref Range Status   Specimen Description   Final    SITE NOT SPECIFIED BOTTLES DRAWN AEROBIC AND ANAEROBIC   Special Requests Blood Culture adequate volume  Final   Culture   Final    NO GROWTH 5 DAYS Performed at [facility], [address], [place], [place] [address]    Report Status 09/18/2019 FINAL  Final  Blood Culture (routine x 2)     Status: None   Collection Time: 09/13/19  1:54 PM   Specimen: Right Antecubital; Blood  Result Value Ref Range Status   Specimen Description   Final    RIGHT ANTECUBITAL BOTTLES DRAWN AEROBIC AND ANAEROBIC   Special Requests Blood Culture adequate volume  Final   Culture   Final    NO GROWTH 5 DAYS Performed at [facility], [address] [place], [place] [address]    Report Status 09/18/2019 FINAL  Final    Time coordinating discharge: Approximately 40 minutes  Patrecia Pour, MD  [facility] 09/18/2019, 10:29 AM

## 2019-09-18 NOTE — Plan of Care (Signed)
  Problem: Respiratory: Goal: Will maintain a patent airway Outcome: Progressing   Problem: Clinical Measurements: Goal: Will remain free from infection Outcome: Progressing   Problem: Clinical Measurements: Goal: Respiratory complications will improve Outcome: Progressing   Problem: Activity: Goal: Risk for activity intolerance will decrease Outcome: Progressing

## 2019-12-25 ENCOUNTER — Ambulatory Visit: Payer: PRIVATE HEALTH INSURANCE

## 2019-12-29 ENCOUNTER — Ambulatory Visit: Payer: 59 | Attending: Family

## 2019-12-29 DIAGNOSIS — Z23 Encounter for immunization: Secondary | ICD-10-CM | POA: Insufficient documentation

## 2019-12-29 NOTE — Progress Notes (Signed)
   Covid-19 Vaccination Clinic  Name:  Sarah Booth    MRN: [id number] DOB: May 01, 1961  12/29/2019  [name] was observed post Covid-19 immunization for 15 minutes without incidence. She was provided with Vaccine Information Sheet and instruction to access the V-Safe system.   [name] was instructed to call 911 with any severe reactions post vaccine: [name] Kitchen Difficulty breathing  . Swelling of your face and throat  . A fast heartbeat  . A bad rash all over your body  . Dizziness and weakness    Immunizations Administered    Name Date Dose VIS Date Route   Moderna COVID-19 Vaccine 12/29/2019  2:47 PM 0.5 mL 10/07/2019 Intramuscular   Manufacturer: Moderna   Lot: [id number]   [id number]

## 2020-01-06 ENCOUNTER — Other Ambulatory Visit: Payer: Self-pay

## 2020-01-06 ENCOUNTER — Ambulatory Visit (AMBULATORY_SURGERY_CENTER): Payer: Self-pay

## 2020-01-06 VITALS — Temp 96.8°F | Ht 64.0 in | Wt 168.0 lb

## 2020-01-06 DIAGNOSIS — Z1211 Encounter for screening for malignant neoplasm of colon: Secondary | ICD-10-CM

## 2020-01-06 DIAGNOSIS — Z01818 Encounter for other preprocedural examination: Secondary | ICD-10-CM

## 2020-01-06 MED ORDER — NA SULFATE-K SULFATE-MG SULF 17.5-3.13-1.6 GM/177ML PO SOLN: 1.0000 | Freq: Once | ORAL | 0 refills | Status: AC

## 2020-01-06 NOTE — Progress Notes (Signed)

## 2020-01-15 ENCOUNTER — Other Ambulatory Visit: Payer: Self-pay

## 2020-01-15 ENCOUNTER — Other Ambulatory Visit: Payer: Self-pay | Admitting: Gastroenterology

## 2020-01-15 ENCOUNTER — Ambulatory Visit (INDEPENDENT_AMBULATORY_CARE_PROVIDER_SITE_OTHER): Payer: 59

## 2020-01-15 DIAGNOSIS — Z1159 Encounter for screening for other viral diseases: Secondary | ICD-10-CM

## 2020-01-16 LAB — SARS CORONAVIRUS 2 (TAT 6-24 HRS): SARS Coronavirus 2: NEGATIVE

## 2020-01-20 ENCOUNTER — Encounter: Payer: Self-pay | Admitting: Gastroenterology

## 2020-01-20 ENCOUNTER — Ambulatory Visit (AMBULATORY_SURGERY_CENTER): Payer: 59 | Admitting: Gastroenterology

## 2020-01-20 ENCOUNTER — Other Ambulatory Visit: Payer: Self-pay

## 2020-01-20 VITALS — BP 145/83 | HR 74 | Temp 96.8°F | Resp 17 | Ht 64.0 in | Wt 168.0 lb

## 2020-01-20 DIAGNOSIS — D122 Benign neoplasm of ascending colon: Secondary | ICD-10-CM | POA: Diagnosis not present

## 2020-01-20 DIAGNOSIS — Z1211 Encounter for screening for malignant neoplasm of colon: Secondary | ICD-10-CM | POA: Diagnosis present

## 2020-01-20 MED ORDER — SODIUM CHLORIDE 0.9 % IV SOLN: 500.0000 mL | Freq: Once | INTRAVENOUS | Status: DC

## 2020-01-20 NOTE — Progress Notes (Signed)
PT taken to PACU. Monitors in place. VSS. Report given to RN. 

## 2020-01-20 NOTE — Progress Notes (Signed)
Temp by LC vitals by DT   Pt's states no medical or surgical changes since previsit or office visit.  

## 2020-01-20 NOTE — Patient Instructions (Signed)
Handouts on polyps and hemorrhoids given to you today  Await pathology results    YOU HAD AN ENDOSCOPIC PROCEDURE TODAY AT THE [facility]:   Refer to the procedure report that was given to you for any specific questions about what was found during the examination.  If the procedure report does not answer your questions, please call your gastroenterologist to clarify.  If you requested that your care partner not be given the details of your procedure findings, then the procedure report has been included in a sealed envelope for you to review at your convenience later.  YOU SHOULD EXPECT: Some feelings of bloating in the abdomen. Passage of more gas than usual.  Walking can help get rid of the air that was put into your GI tract during the procedure and reduce the bloating. If you had a lower endoscopy (such as a colonoscopy or flexible sigmoidoscopy) you may notice spotting of blood in your stool or on the toilet paper. If you underwent a bowel prep for your procedure, you may not have a normal bowel movement for a few days.  Please Note:  You might notice some irritation and congestion in your nose or some drainage.  This is from the oxygen used during your procedure.  There is no need for concern and it should clear up in a day or so.  SYMPTOMS TO REPORT IMMEDIATELY:   Following lower endoscopy (colonoscopy or flexible sigmoidoscopy):  Excessive amounts of blood in the stool  Significant tenderness or worsening of abdominal pains  Swelling of the abdomen that is new, acute  Fever of 100F or higher  For urgent or emergent issues, a gastroenterologist can be reached at any hour by calling [phone]. Do not use MyChart messaging for urgent concerns.    DIET:  We do recommend a small meal at first, but then you may proceed to your regular diet.  Drink plenty of fluids but you should avoid alcoholic beverages for 24 hours.  ACTIVITY:  You should plan to take it easy for the  rest of today and you should NOT DRIVE or use heavy machinery until tomorrow (because of the sedation medicines used during the test).    FOLLOW UP: Our staff will call the number listed on your records 48-72 hours following your procedure to check on you and address any questions or concerns that you may have regarding the information given to you following your procedure. If we do not reach you, we will leave a message.  We will attempt to reach you two times.  During this call, we will ask if you have developed any symptoms of COVID 19. If you develop any symptoms (ie: fever, flu-like symptoms, shortness of breath, cough etc.) before then, please call [phone].  If you test positive for Covid 19 in the 2 weeks post procedure, please call and report this information to us.    If any biopsies were taken you will be contacted by phone or by letter within the next 1-3 weeks.  Please call us at [phone] if you have not heard about the biopsies in 3 weeks.    SIGNATURES/CONFIDENTIALITY: You and/or your care partner have signed paperwork which will be entered into your electronic medical record.  These signatures attest to the fact that that the information above on your After Visit Summary has been reviewed and is understood.  Full responsibility of the confidentiality of this discharge information lies with you and/or your care-partner. 

## 2020-01-20 NOTE — Progress Notes (Signed)
Called to room to assist during endoscopic procedure.  Patient ID and intended procedure confirmed with present staff. Received instructions for my participation in the procedure from the performing physician.  

## 2020-01-20 NOTE — Op Note (Signed)
[facility] Patient Name: Sarah Booth Procedure Date: 01/20/2020 11:46 AM MRN: [id number] Endoscopist: Mauri Pole , MD Age: 59 Referring MD:  Date of Birth: Apr 15, 1961 Gender: Female Account #: [id number] Procedure:                Colonoscopy Indications:              Screening for colorectal malignant neoplasm Medicines:                Monitored Anesthesia Care Procedure:                Pre-Anesthesia Assessment:                           - Prior to the procedure, a History and Physical                            was performed, and patient medications and                            allergies were reviewed. The patient's tolerance of                            previous anesthesia was also reviewed. The risks                            and benefits of the procedure and the sedation                            options and risks were discussed with the patient.                            All questions were answered, and informed consent                            was obtained. Prior Anticoagulants: The patient has                            taken no previous anticoagulant or antiplatelet                            agents. ASA Grade Assessment: II - A patient with                            mild systemic disease. After reviewing the risks                            and benefits, the patient was deemed in                            satisfactory condition to undergo the procedure.                           After obtaining informed consent, the colonoscope  was passed under direct vision. Throughout the                            procedure, the patient's blood pressure, pulse, and                            oxygen saturations were monitored continuously. The                            Colonoscope was introduced through the anus and                            advanced to the the cecum, identified by                            appendiceal orifice  and ileocecal valve. The                            colonoscopy was performed without difficulty. The                            patient tolerated the procedure well. The quality                            of the bowel preparation was good. The ileocecal                            valve, appendiceal orifice, and rectum were                            photographed. Scope In: [id number] AM Scope Out: 12:08:26 PM Scope Withdrawal Time: 0 hours 14 minutes 46 seconds  Total Procedure Duration: 0 hours 21 minutes 29 seconds  Findings:                 The perianal and digital rectal examinations were                            normal.                           A 2 mm polyp was found in the ascending colon. The                            polyp was sessile. The polyp was removed with a                            cold biopsy forceps. Resection and retrieval were                            complete.                           Non-bleeding internal hemorrhoids were found during  retroflexion. The hemorrhoids were small.                           The exam was otherwise without abnormality. Complications:            No immediate complications. Estimated Blood Loss:     Estimated blood loss was minimal. Impression:               - One 2 mm polyp in the ascending colon, removed                            with a cold biopsy forceps. Resected and retrieved.                           - Non-bleeding internal hemorrhoids.                           - The examination was otherwise normal. Recommendation:           - Patient has a contact number available for                            emergencies. The signs and symptoms of potential                            delayed complications were discussed with the                            patient. Return to normal activities tomorrow.                            Written discharge instructions were provided to the                             patient.                           - Resume previous diet.                           - Continue present medications.                           - Await pathology results.                           - Repeat colonoscopy in 5-10 years for surveillance                            based on pathology results. Mauri Pole, MD 01/20/2020 12:13:23 PM This report has been signed electronically.

## 2020-01-22 ENCOUNTER — Telehealth: Payer: Self-pay | Admitting: *Deleted

## 2020-01-22 NOTE — Telephone Encounter (Signed)
  Follow up Call-  Call back number 01/20/2020  Post procedure Call Back phone  # [phone]  Permission to leave phone message Yes  Some recent data might be hidden     Patient questions:  Do you have a fever, pain , or abdominal swelling? No. Pain Score  0 *  Have you tolerated food without any problems? Yes.    Have you been able to return to your normal activities? Yes.    Do you have any questions about your discharge instructions: Diet   No. Medications  No. Follow up visit  No.  Do you have questions or concerns about your Care? No.  Actions: * If pain score is 4 or above: No action needed, pain <4.  1. Have you developed a fever since your procedure? no  2.   Have you had an respiratory symptoms (SOB or cough) since your procedure? no  3.   Have you tested positive for COVID 19 since your procedure no  4.   Have you had any family members/close contacts diagnosed with the COVID 19 since your procedure?  no   If yes to any of these questions please route to Joylene John, RN and Alphonsa Gin, Therapist, sports.

## 2020-01-27 ENCOUNTER — Encounter: Payer: Self-pay | Admitting: Gastroenterology

## 2020-02-03 ENCOUNTER — Ambulatory Visit: Payer: 59 | Attending: Family

## 2020-02-03 DIAGNOSIS — Z23 Encounter for immunization: Secondary | ICD-10-CM

## 2020-02-03 NOTE — Progress Notes (Signed)
   Covid-19 Vaccination Clinic  Name:  Sarah Booth    MRN: [id number] DOB: Mar 19, 1961  02/03/2020  Sarah Booth was observed post Covid-19 immunization for 15 minutes without incident. She was provided with Vaccine Information Sheet and instruction to access the V-Safe system.   Sarah Booth was instructed to call 911 with any severe reactions post vaccine: [name] Kitchen Difficulty breathing  . Swelling of face and throat  . A fast heartbeat  . A bad rash all over body  . Dizziness and weakness   Immunizations Administered    Name Date Dose VIS Date Route   Moderna COVID-19 Vaccine 02/03/2020  3:33 PM 0.5 mL 10/07/2019 Intramuscular   Manufacturer: Moderna   Lot: [id number]   [id number]

## 2020-03-08 ENCOUNTER — Ambulatory Visit: Payer: 59 | Admitting: Family Medicine

## 2020-03-12 ENCOUNTER — Ambulatory Visit: Payer: 59 | Admitting: Family Medicine

## 2020-04-27 ENCOUNTER — Ambulatory Visit: Payer: 59 | Admitting: Family Medicine

## 2020-05-03 ENCOUNTER — Ambulatory Visit: Payer: 59 | Admitting: Family Medicine

## 2020-05-25 ENCOUNTER — Other Ambulatory Visit: Payer: Self-pay

## 2020-05-25 ENCOUNTER — Ambulatory Visit (INDEPENDENT_AMBULATORY_CARE_PROVIDER_SITE_OTHER): Payer: 59

## 2020-05-25 ENCOUNTER — Ambulatory Visit (INDEPENDENT_AMBULATORY_CARE_PROVIDER_SITE_OTHER): Payer: 59 | Admitting: Family Medicine

## 2020-05-25 ENCOUNTER — Encounter: Payer: Self-pay | Admitting: Family Medicine

## 2020-05-25 DIAGNOSIS — R2232 Localized swelling, mass and lump, left upper limb: Secondary | ICD-10-CM

## 2020-05-25 NOTE — Progress Notes (Signed)
Office Visit Note   Patient: Chrystina Naff           Date of Birth: [birth year]           MRN: [id number] Visit Date: 05/25/2020 Requested by: Chesley Noon, MD [place],  [place] [address] PCP: Chesley Noon, MD  Subjective: Chief Complaint  Patient presents with  . Left Wrist - Pain, Cyst    Was aspirated 07/25/19. Wrist hurts off & on. Right-hand dominant.    HPI: She is here with recurrent left wrist pain.  We aspirated a cyst last September.  She states that it went away for quite a while, did not seem to bother her until about a month ago.  It hurts again and she notices the nodule.  Pain on the radial side of her wrist.  She is using Voltaren gel occasionally.              ROS:   All other systems were reviewed and are negative.  Objective: Vital Signs: LMP 05/07/2015 (Approximate)   Physical Exam:  General:  Alert and oriented, in no acute distress. Pulm:  Breathing unlabored. Psy:  Normal mood, congruent affect. Skin: No erythema or rash Left wrist: Full active range of motion with some pain at the extremes.  She has a small nodule in the first dorsal compartment and has tenderness to palpation there.  She also has pain with Wynn Maudlin test.  She has a little bit of pain with thumb extension against resistance.  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  No significant spurring at the distal radius.  No soft tissue calcifications.   Assessment & Plan: 1.  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  If symptoms persist, then we could consider therapy for iontophoresis.  Repeat injection or surgical excision would be other considerations.     Procedures: No procedures performed  No notes on file     PMFS History: Patient  Active Problem List   Diagnosis Date Noted  . Hypokalemia 09/14/2019  . Diarrhea due to COVID-19 09/14/2019  . Acute hypoxemic respiratory failure ([facility]) 09/13/2019  . Pneumonia due to COVID-19 virus 09/13/2019   Past Medical History:  Diagnosis Date  . Allergy    per pt  . Depression   . Migraine   . Migraine headache     Family History  Problem Relation Age of Onset  . Diabetes Sister   . Diabetes Sister   . Diabetes Sister   . Diabetes Sister   . Diabetes Sister   . Diabetes Sister   . Colon cancer Neg Hx   . Colon polyps Neg Hx   . Esophageal cancer Neg Hx   . Rectal cancer Neg Hx   . Stomach cancer Neg Hx     Past Surgical History:  Procedure Laterality Date  . BREAST BIOPSY    . COLOSTOMY  2010  . STOMACH SURGERY  2008   Tumor   Social History   Occupational History  . Not on file  Tobacco Use  . Smoking status: Never Smoker  . Smokeless tobacco: Never Used  Vaping Use  . Vaping Use: Never used  Substance and Sexual Activity  . Alcohol use: No    Alcohol/week: 0.0 standard  drinks  . Drug use: No  . Sexual activity: Not on file

## 2020-08-13 ENCOUNTER — Other Ambulatory Visit: Payer: Self-pay | Admitting: Obstetrics and Gynecology

## 2020-08-13 DIAGNOSIS — Z0000 Encounter for general adult medical examination without abnormal findings: Secondary | ICD-10-CM

## 2020-09-02 ENCOUNTER — Ambulatory Visit: Payer: 59 | Attending: Internal Medicine

## 2020-09-02 DIAGNOSIS — Z23 Encounter for immunization: Secondary | ICD-10-CM

## 2020-09-02 NOTE — Progress Notes (Signed)
   Covid-19 Vaccination Clinic  Name:  Sarah Booth    MRN: [id number] DOB: 1960/11/25  09/02/2020  Sarah Booth was observed post Covid-19 immunization for 15 minutes without incident. She was provided with Vaccine Information Sheet and instruction to access the V-Safe system.   Sarah Booth was instructed to call 911 with any severe reactions post vaccine: [name] Kitchen Difficulty breathing  . Swelling of face and throat  . A fast heartbeat  . A bad rash all over body  . Dizziness and weakness

## 2020-09-07 ENCOUNTER — Ambulatory Visit: Payer: 59

## 2020-10-18 ENCOUNTER — Other Ambulatory Visit: Payer: Self-pay

## 2020-10-18 ENCOUNTER — Ambulatory Visit
Admission: RE | Admit: 2020-10-18 | Discharge: 2020-10-18 | Disposition: A | Discharge status: Home / Self Care | Payer: 59 | Source: Ambulatory Visit | Attending: Obstetrics and Gynecology | Admitting: Obstetrics and Gynecology

## 2020-10-18 DIAGNOSIS — Z0000 Encounter for general adult medical examination without abnormal findings: Secondary | ICD-10-CM

## 2021-10-03 ENCOUNTER — Other Ambulatory Visit: Payer: Self-pay | Admitting: Obstetrics and Gynecology

## 2021-10-03 DIAGNOSIS — Z1231 Encounter for screening mammogram for malignant neoplasm of breast: Secondary | ICD-10-CM

## 2021-11-03 ENCOUNTER — Ambulatory Visit
Admission: RE | Admit: 2021-11-03 | Discharge: 2021-11-03 | Disposition: A | Discharge status: Home / Self Care | Payer: 59 | Source: Ambulatory Visit | Attending: Obstetrics and Gynecology | Admitting: Obstetrics and Gynecology

## 2021-11-03 DIAGNOSIS — Z1231 Encounter for screening mammogram for malignant neoplasm of breast: Secondary | ICD-10-CM

## 2022-06-09 IMAGING — MG MM DIGITAL SCREENING BILAT W/ TOMO AND CAD
8 series · 8 of 24 positions shown · non-contrast
Comparison: Previous exam(s).

CLINICAL DATA: Screening.

EXAM:
DIGITAL SCREENING BILATERAL MAMMOGRAM WITH TOMOSYNTHESIS AND CAD
TECHNIQUE: Bilateral screening digital craniocaudal and mediolateral oblique
mammograms were obtained. Bilateral screening digital breast
tomosynthesis was performed. The images were evaluated with
computer-aided detection.

[L CC synth-2D]
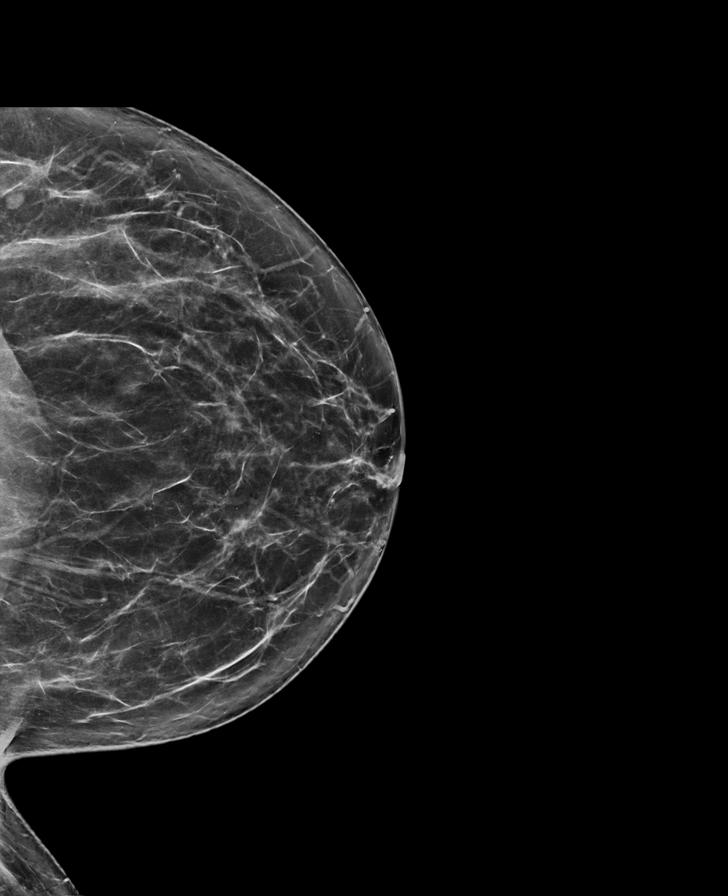

[R MLO synth-2D]
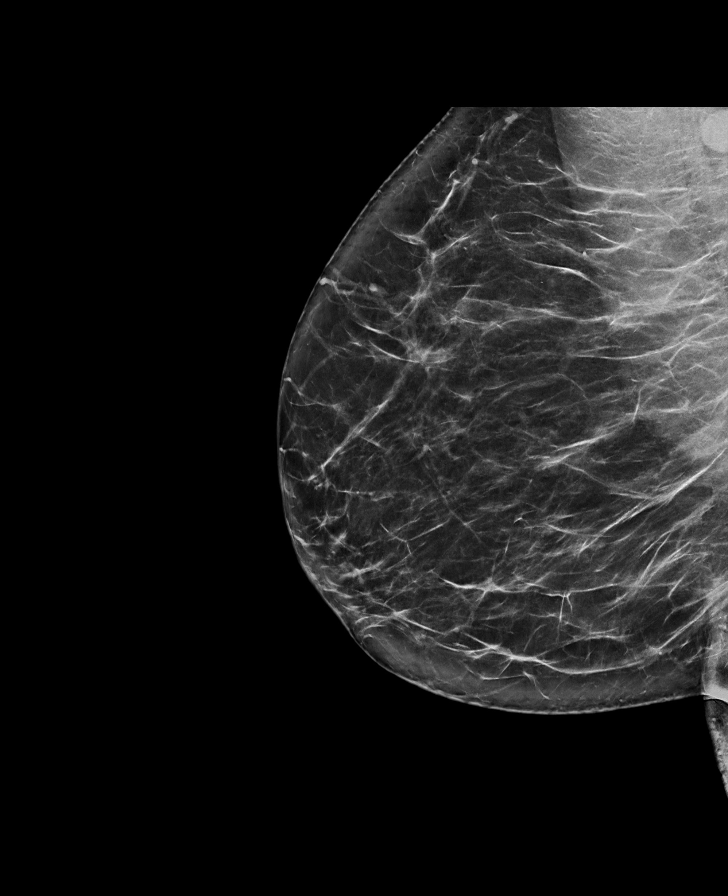

[L MLO synth-2D]
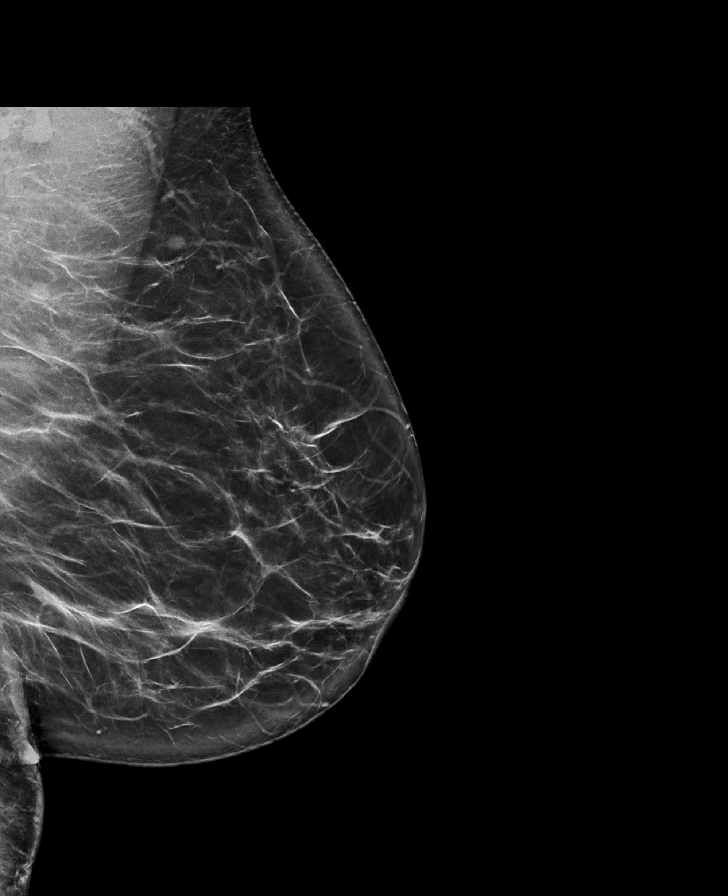

[R CC synth-2D]
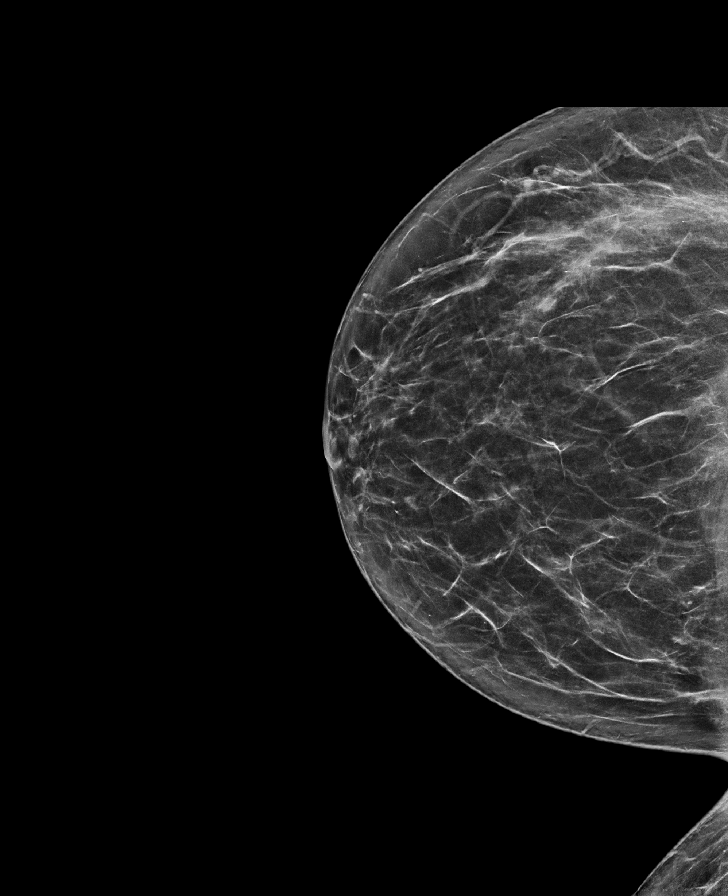

[L MLO tomo · tomo slice 45/90.0]
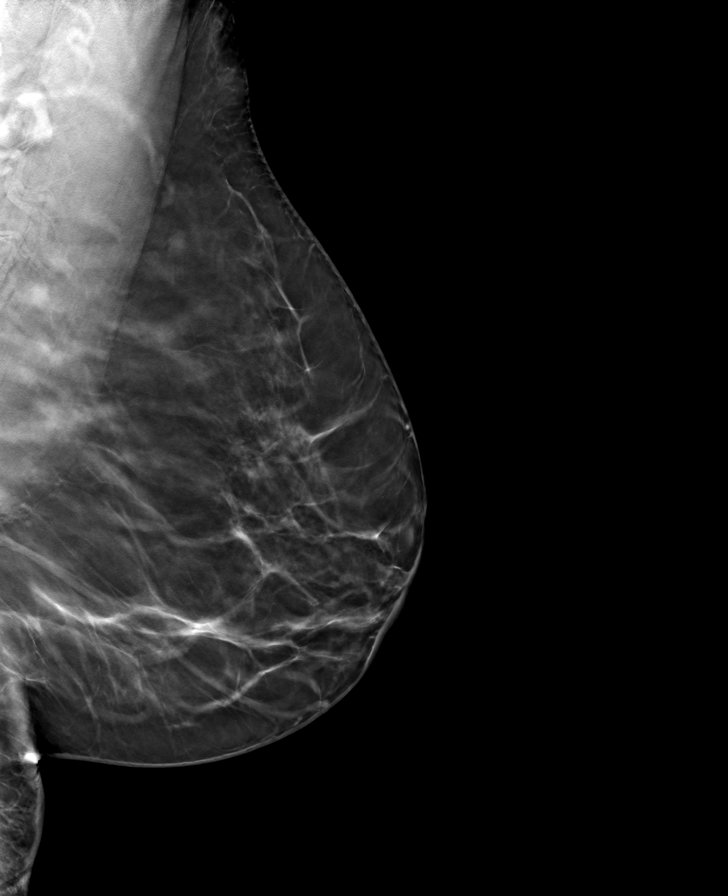

[L CC tomo · tomo slice 39/78.0]
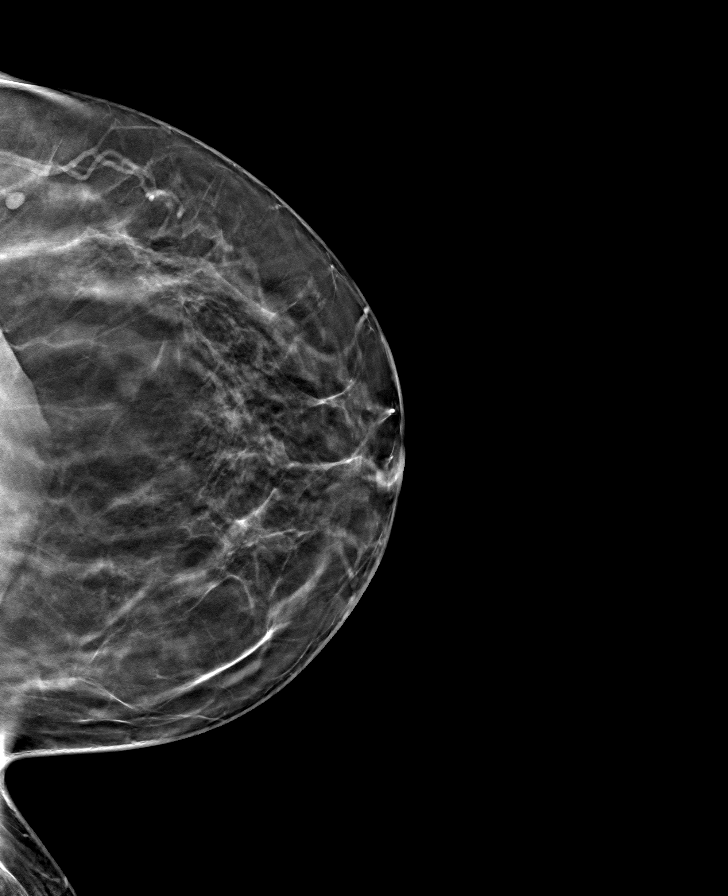

[R CC tomo · tomo slice 38/75.0]
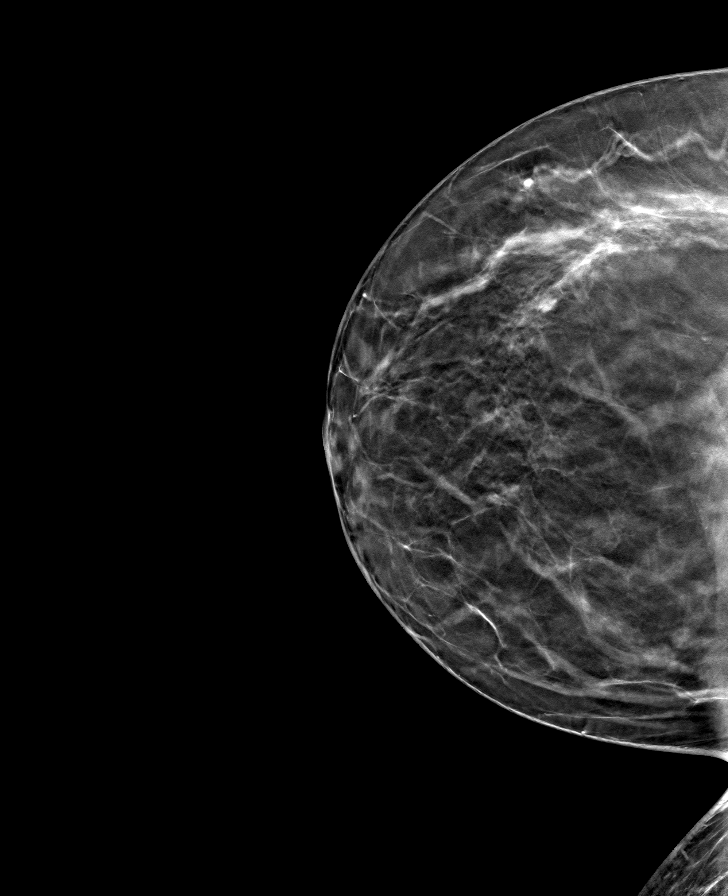

[R MLO tomo · tomo slice 44/87.0]
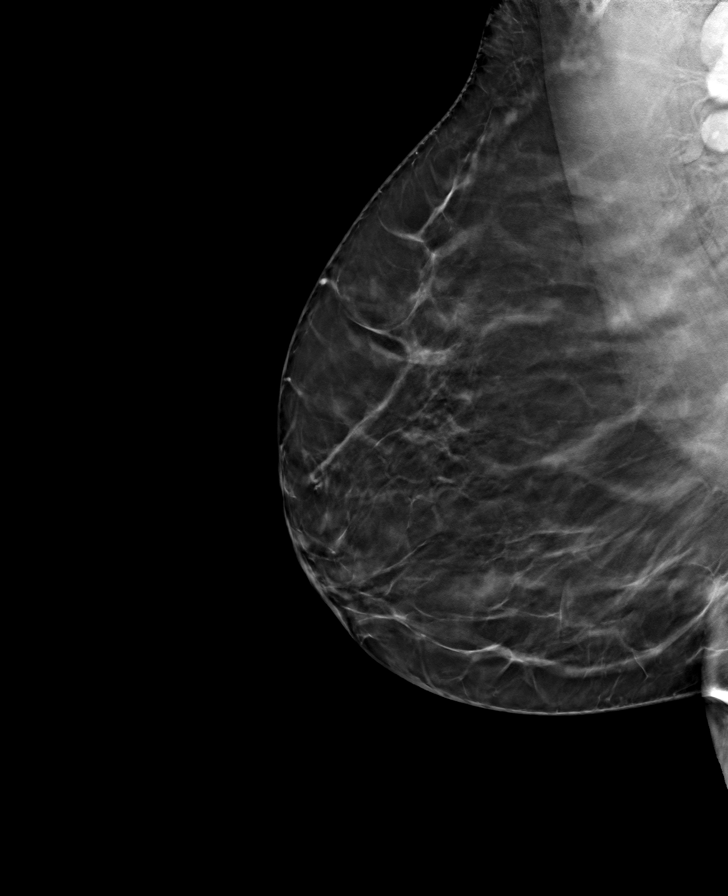

[8 of 24 positions shown; findings below may reference images not displayed]

ACR Breast Density Category b: There are scattered areas of
fibroglandular density.
FINDINGS: There are no findings suspicious for malignancy.
IMPRESSION: No mammographic evidence of malignancy. A result letter of this
screening mammogram will be mailed directly to the patient.

RECOMMENDATION:
Screening mammogram in one year. (Code:51-O-LD2)

BI-RADS CATEGORY  1: Negative.

## 2022-11-16 ENCOUNTER — Other Ambulatory Visit: Payer: Self-pay | Admitting: Obstetrics and Gynecology

## 2022-11-16 DIAGNOSIS — Z1231 Encounter for screening mammogram for malignant neoplasm of breast: Secondary | ICD-10-CM

## 2022-11-21 ENCOUNTER — Ambulatory Visit: Payer: Self-pay

## 2022-11-23 ENCOUNTER — Ambulatory Visit
Admission: RE | Admit: 2022-11-23 | Discharge: 2022-11-23 | Disposition: A | Discharge status: Home / Self Care | Payer: BC Managed Care – PPO | Source: Ambulatory Visit | Attending: Obstetrics and Gynecology | Admitting: Obstetrics and Gynecology

## 2022-11-23 DIAGNOSIS — Z1231 Encounter for screening mammogram for malignant neoplasm of breast: Secondary | ICD-10-CM

## 2023-05-14 ENCOUNTER — Other Ambulatory Visit: Payer: Self-pay

## 2023-05-14 MED ORDER — LOSARTAN POTASSIUM 25 MG PO TABS
25.0000 mg | ORAL_TABLET | Freq: Every day | ORAL | 2 refills | Status: AC
  Filled 2023-05-14: qty 90, 90d supply, fill #0

## 2023-05-14 MED ORDER — BUPROPION HCL ER (XL) 300 MG PO TB24
300.0000 mg | ORAL_TABLET | Freq: Every morning | ORAL | 1 refills | Status: AC
  Filled 2024-02-13: qty 90, 90d supply, fill #0

## 2023-05-14 MED ORDER — PRAVASTATIN SODIUM 40 MG PO TABS: 40.0000 mg | ORAL_TABLET | Freq: Every evening | ORAL | 3 refills | Status: DC

## 2023-05-14 MED ORDER — ALPRAZOLAM 0.25 MG PO TABS
0.2500 mg | ORAL_TABLET | Freq: Three times a day (TID) | ORAL | 0 refills | Status: DC | PRN
  Filled 2023-05-14: qty 60, 20d supply, fill #0

## 2023-05-14 MED ORDER — SUMATRIPTAN SUCCINATE 100 MG PO TABS
100.0000 mg | ORAL_TABLET | Freq: Every day | ORAL | 0 refills | Status: AC | PRN
  Filled 2023-05-14: qty 9, 23d supply, fill #0

## 2023-05-14 MED ORDER — LOSARTAN POTASSIUM 25 MG PO TABS
25.0000 mg | ORAL_TABLET | Freq: Every day | ORAL | 1 refills | Status: AC
  Filled 2023-05-14: qty 30, 30d supply, fill #0

## 2023-05-17 ENCOUNTER — Other Ambulatory Visit: Payer: Self-pay

## 2023-06-27 ENCOUNTER — Other Ambulatory Visit: Payer: Self-pay

## 2023-06-27 MED ORDER — FLUOXETINE HCL 20 MG PO CAPS
20.0000 mg | ORAL_CAPSULE | Freq: Every day | ORAL | 1 refills | Status: DC
  Filled 2023-06-27: qty 30, 30d supply, fill #0
  Filled 2023-07-23: qty 30, 30d supply, fill #1

## 2023-06-28 ENCOUNTER — Other Ambulatory Visit: Payer: Self-pay

## 2023-06-28 MED ORDER — CHOLECALCIFEROL 1.25 MG (50000 UT) PO CAPS
50000.0000 [IU] | ORAL_CAPSULE | ORAL | 0 refills | Status: AC
  Filled 2023-06-28: qty 12, 84d supply, fill #0

## 2023-07-03 ENCOUNTER — Other Ambulatory Visit: Payer: Self-pay

## 2023-07-11 ENCOUNTER — Other Ambulatory Visit: Payer: Self-pay

## 2023-07-11 MED ORDER — BUPROPION HCL ER (XL) 150 MG PO TB24
150.0000 mg | ORAL_TABLET | Freq: Every morning | ORAL | 0 refills | Status: DC
  Filled 2023-07-11: qty 90, 90d supply, fill #0

## 2023-07-27 ENCOUNTER — Other Ambulatory Visit: Payer: Self-pay

## 2023-08-10 ENCOUNTER — Other Ambulatory Visit: Payer: Self-pay

## 2023-08-10 MED ORDER — LOSARTAN POTASSIUM 25 MG PO TABS
50.0000 mg | ORAL_TABLET | Freq: Every day | ORAL | 2 refills | Status: AC
  Filled 2023-08-10: qty 60, 30d supply, fill #0
  Filled 2024-02-26: qty 60, 30d supply, fill #1

## 2023-08-13 ENCOUNTER — Other Ambulatory Visit: Payer: Self-pay

## 2023-08-16 ENCOUNTER — Other Ambulatory Visit: Payer: Self-pay

## 2023-08-24 ENCOUNTER — Other Ambulatory Visit: Payer: Self-pay

## 2023-08-24 MED ORDER — FLUOXETINE HCL 20 MG PO CAPS
20.0000 mg | ORAL_CAPSULE | Freq: Every day | ORAL | 1 refills | Status: AC
  Filled 2023-08-24: qty 30, 30d supply, fill #0
  Filled 2024-02-13: qty 30, 30d supply, fill #1

## 2023-08-27 ENCOUNTER — Other Ambulatory Visit: Payer: Self-pay

## 2023-08-27 MED ORDER — PRAVASTATIN SODIUM 40 MG PO TABS
40.0000 mg | ORAL_TABLET | Freq: Every evening | ORAL | 3 refills | Status: DC
  Filled 2023-08-27 – 2024-04-01 (×3): qty 90, 90d supply, fill #0
  Filled 2024-07-21: qty 90, 90d supply, fill #1

## 2023-09-05 ENCOUNTER — Other Ambulatory Visit: Payer: Self-pay

## 2023-09-06 ENCOUNTER — Other Ambulatory Visit (HOSPITAL_COMMUNITY): Payer: Self-pay

## 2023-09-06 ENCOUNTER — Other Ambulatory Visit: Payer: Self-pay

## 2023-09-06 MED ORDER — LOSARTAN POTASSIUM 25 MG PO TABS
75.0000 mg | ORAL_TABLET | Freq: Every day | ORAL | 1 refills | Status: DC
  Filled 2023-09-06: qty 270, 90d supply, fill #0
  Filled 2023-12-05: qty 270, 90d supply, fill #1

## 2023-09-06 MED ORDER — FLUOXETINE HCL 40 MG PO CAPS
40.0000 mg | ORAL_CAPSULE | Freq: Every day | ORAL | 1 refills | Status: DC
  Filled 2023-09-06: qty 90, 90d supply, fill #0
  Filled 2024-02-13: qty 90, 90d supply, fill #1

## 2023-09-07 ENCOUNTER — Other Ambulatory Visit: Payer: Self-pay

## 2023-09-07 MED ORDER — PRAVASTATIN SODIUM 40 MG PO TABS
40.0000 mg | ORAL_TABLET | Freq: Every evening | ORAL | 3 refills | Status: AC
  Filled 2023-09-07: qty 90, 90d supply, fill #0
  Filled 2023-12-26: qty 90, 90d supply, fill #1

## 2023-09-10 ENCOUNTER — Other Ambulatory Visit: Payer: Self-pay

## 2023-09-11 ENCOUNTER — Other Ambulatory Visit: Payer: Self-pay

## 2023-10-02 ENCOUNTER — Other Ambulatory Visit: Payer: Self-pay

## 2023-10-02 MED ORDER — BUPROPION HCL ER (XL) 150 MG PO TB24
150.0000 mg | ORAL_TABLET | Freq: Every morning | ORAL | 0 refills | Status: DC
  Filled 2023-10-02: qty 90, 90d supply, fill #0

## 2023-10-05 ENCOUNTER — Other Ambulatory Visit: Payer: Self-pay

## 2023-11-02 ENCOUNTER — Other Ambulatory Visit: Payer: Self-pay

## 2023-11-02 MED ORDER — FLUOXETINE HCL 20 MG PO CAPS
20.0000 mg | ORAL_CAPSULE | Freq: Every day | ORAL | 2 refills | Status: DC
  Filled 2023-11-02: qty 30, 30d supply, fill #0
  Filled 2023-12-05: qty 30, 30d supply, fill #1
  Filled 2024-01-15: qty 30, 30d supply, fill #2

## 2023-11-08 ENCOUNTER — Other Ambulatory Visit: Payer: Self-pay

## 2023-12-05 ENCOUNTER — Other Ambulatory Visit: Payer: Self-pay

## 2023-12-12 ENCOUNTER — Other Ambulatory Visit: Payer: Self-pay

## 2023-12-26 ENCOUNTER — Other Ambulatory Visit: Payer: Self-pay

## 2023-12-27 ENCOUNTER — Other Ambulatory Visit: Payer: Self-pay

## 2023-12-27 MED ORDER — BUPROPION HCL ER (XL) 150 MG PO TB24
150.0000 mg | ORAL_TABLET | Freq: Every morning | ORAL | 0 refills | Status: DC
  Filled 2023-12-27: qty 90, 90d supply, fill #0

## 2023-12-31 ENCOUNTER — Other Ambulatory Visit: Payer: Self-pay

## 2024-01-15 ENCOUNTER — Other Ambulatory Visit: Payer: Self-pay

## 2024-01-15 ENCOUNTER — Other Ambulatory Visit: Payer: Self-pay | Admitting: Obstetrics and Gynecology

## 2024-01-15 DIAGNOSIS — Z1231 Encounter for screening mammogram for malignant neoplasm of breast: Secondary | ICD-10-CM

## 2024-01-16 ENCOUNTER — Ambulatory Visit
Admission: RE | Admit: 2024-01-16 | Discharge: 2024-01-16 | Discharge status: Home / Self Care | Source: Ambulatory Visit | Attending: Obstetrics and Gynecology | Admitting: Obstetrics and Gynecology

## 2024-01-16 ENCOUNTER — Other Ambulatory Visit: Payer: Self-pay

## 2024-01-16 DIAGNOSIS — Z1231 Encounter for screening mammogram for malignant neoplasm of breast: Secondary | ICD-10-CM

## 2024-01-17 ENCOUNTER — Other Ambulatory Visit: Payer: Self-pay

## 2024-01-17 MED ORDER — FLUOXETINE HCL 20 MG PO CAPS
20.0000 mg | ORAL_CAPSULE | Freq: Every day | ORAL | 0 refills | Status: DC
  Filled 2024-06-20: qty 90, 90d supply, fill #0

## 2024-02-10 ENCOUNTER — Encounter: Payer: Self-pay | Admitting: Emergency Medicine

## 2024-02-10 ENCOUNTER — Ambulatory Visit
Admission: EM | Admit: 2024-02-10 | Discharge: 2024-02-10 | Disposition: A | Discharge status: Home / Self Care | Attending: Family Medicine | Admitting: Family Medicine

## 2024-02-10 ENCOUNTER — Other Ambulatory Visit: Payer: Self-pay

## 2024-02-10 DIAGNOSIS — S46912A Strain of unspecified muscle, fascia and tendon at shoulder and upper arm level, left arm, initial encounter: Secondary | ICD-10-CM | POA: Diagnosis not present

## 2024-02-10 MED ORDER — DEXAMETHASONE SODIUM PHOSPHATE 10 MG/ML IJ SOLN
10.0000 mg | Freq: Once | INTRAMUSCULAR | Status: AC
  Administered 2024-02-10: 10 mg via INTRAMUSCULAR

## 2024-02-10 MED ORDER — TIZANIDINE HCL 4 MG PO CAPS: 4.0000 mg | ORAL_CAPSULE | Freq: Three times a day (TID) | ORAL | 0 refills | Status: DC | PRN

## 2024-02-10 NOTE — ED Provider Notes (Signed)
 [facility]    CSN: [id number] Arrival date & time: 02/10/24  0809      History   Chief Complaint Chief Complaint  Patient presents with   Neck Pain    HPI Sarah Booth is a 63 y.o. female.   Patient presenting today with 1 day history of left-sided neck and shoulder pain with stiffness, soreness.  States the pain is now radiating down the upper arm and she is having some decreased range of motion due to pain.  Denies swelling, redness, rashes, known injury to the area, severe numbness down to the hand but does note some tingling to the hand.  So far trying Voltaren gel and lidocaine patches with minimal relief.    Past Medical History:  Diagnosis Date   Allergy    per pt   Depression    Migraine    Migraine headache     Patient Active Problem List   Diagnosis Date Noted   Hypokalemia 09/14/2019   Diarrhea due to COVID-19 09/14/2019   Acute hypoxemic respiratory failure ([facility]) 09/13/2019   Pneumonia due to COVID-19 virus 09/13/2019    Past Surgical History:  Procedure Laterality Date   BREAST BIOPSY     COLOSTOMY  2010   STOMACH SURGERY  2008   Tumor    OB History   No obstetric history on file.      Home Medications    Prior to Admission medications   Medication Sig Start Date End Date Taking? Authorizing Provider  tiZANidine (ZANAFLEX) 4 MG capsule Take 1 capsule (4 mg total) by mouth 3 (three) times daily as needed for muscle spasms. Do not drink alcohol or drive while taking this medication.  May cause drowsiness. 02/10/24  Yes Particia Nearing, PA-C  albuterol (PROVENTIL) (2.5 MG/3ML) 0.083% nebulizer solution Take 3 mLs (2.5 mg dose) by nebulization every 4 (four) hours as needed for Wheezing. 09/10/19   [provider]  ALPRAZolam Prudy Feeler) 0.25 MG tablet Take 0.25 mg by mouth 3 (three) times daily as needed. 05/21/20   [provider]  ALPRAZolam Prudy Feeler) 0.25 MG tablet Take 1 tablet (0.25 mg total) by mouth 3  (three) times daily as needed for sleep. 05/14/23     buPROPion (WELLBUTRIN XL) 150 MG 24 hr tablet Take 150 mg by mouth every morning.  05/22/19   [provider]  buPROPion (WELLBUTRIN XL) 150 MG 24 hr tablet Take 1 tablet (150 mg total) by mouth in the morning. 12/27/23     buPROPion (WELLBUTRIN XL) 300 MG 24 hr tablet Take 1 tablet (300 mg total) by mouth in the morning. 04/17/23   Eartha Inch, MD  chlorhexidine (PERIDEX) 0.12 % solution Use as directed 15 mLs in the mouth or throat 2 (two) times daily.  09/02/19   [provider]  Cholecalciferol 1.25 MG (50000 UT) capsule Take 1 capsule (50,000 Units total) by mouth once a week. 06/28/23     Cholecalciferol 1.25 MG (50000 UT) TABS Take by mouth.    [provider]  Cyanocobalamin 500 MCG SUBL Place under the tongue.    [provider]  diclofenac Sodium (VOLTAREN) 1 % GEL Apply 2 g topically 4 (four) times daily.    [provider]  FLUoxetine (PROZAC) 20 MG capsule Take 1 capsule (20 mg total) by mouth daily. 08/24/23     FLUoxetine (PROZAC) 20 MG capsule Take 1 capsule (20 mg total) by mouth daily. 11/02/23     FLUoxetine (PROZAC)  20 MG capsule Take 1 capsule (20 mg total) by mouth daily. 01/17/24     FLUoxetine (PROZAC) 40 MG capsule Take 1 capsule (40 mg total) by mouth daily. 09/05/23     losartan (COZAAR) 25 MG tablet Take 1 tablet (25 mg total) by mouth daily. 05/14/23     losartan (COZAAR) 25 MG tablet Take 1 tablet (25 mg total) by mouth daily. 04/18/23   Elder Negus, NP  losartan (COZAAR) 25 MG tablet Take 2 tablets (50 mg total) by mouth daily. 08/10/23     losartan (COZAAR) 25 MG tablet Take 3 tablets (75 mg total) by mouth daily. 09/05/23     Multiple Vitamin (MULTIVITAMIN) tablet Take by mouth.    [provider]  pravastatin (PRAVACHOL) 40 MG tablet Take 1 tablet (40 mg total) by mouth every evening. 08/26/23     pravastatin (PRAVACHOL) 40 MG tablet Take 1 tablet (40 mg  total) by mouth every evening. 09/07/23     SUMAtriptan (IMITREX) 100 MG tablet Take 100 mg by mouth every 2 (two) hours as needed for migraine or headache.  07/04/19   [provider]  SUMAtriptan (IMITREX) 100 MG tablet Take 1 tablet (100 mg total) by mouth daily as needed. 04/02/23   Eartha Inch, MD  VITAMIN E PO Take by mouth.    [provider]    Family History Family History  Problem Relation Age of Onset   Diabetes Sister    Diabetes Sister    Diabetes Sister    Diabetes Sister    Diabetes Sister    Diabetes Sister    Colon cancer Neg Hx    Colon polyps Neg Hx    Esophageal cancer Neg Hx    Rectal cancer Neg Hx    Stomach cancer Neg Hx     Social History Social History   Tobacco Use   Smoking status: Never   Smokeless tobacco: Never  Vaping Use   Vaping status: Never Used  Substance Use Topics   Alcohol use: No    Alcohol/week: 0.0 standard drinks of alcohol   Drug use: No     Allergies   Patient has no known allergies.   Review of Systems Review of Systems Per HPI  Physical Exam Triage Vital Signs ED Triage Vitals  Encounter Vitals Group     BP 02/10/24 0829 (!) 160/93     Systolic BP Percentile --      Diastolic BP Percentile --      Pulse Rate 02/10/24 0829 75     Resp 02/10/24 0829 20     Temp 02/10/24 0829 98.7 F (37.1 C)     Temp Source 02/10/24 0829 Oral     SpO2 02/10/24 0829 94 %     Weight --      Height --      Head Circumference --      Peak Flow --      Pain Score 02/10/24 0827 9     Pain Loc --      Pain Education --      Exclude from Growth Chart --    No data found.  Updated Vital Signs BP (!) 160/93 (BP Location: Right Arm)   Pulse 75   Temp 98.7 F (37.1 C) (Oral)   Resp 20   LMP 05/07/2015 (Approximate)   SpO2 94%   Visual Acuity Right Eye Distance:   Left Eye Distance:   Bilateral Distance:    Right Eye Near:  Left Eye Near:    Bilateral Near:     Physical Exam Vitals and nursing  note reviewed.  Constitutional:      Appearance: Normal appearance. She is not ill-appearing.  HENT:     Head: Atraumatic.  Eyes:     Extraocular Movements: Extraocular movements intact.     Conjunctiva/sclera: Conjunctivae normal.  Cardiovascular:     Rate and Rhythm: Normal rate and regular rhythm.     Heart sounds: Normal heart sounds.  Pulmonary:     Effort: Pulmonary effort is normal.     Breath sounds: Normal breath sounds.  Musculoskeletal:        General: Tenderness present. No swelling, deformity or signs of injury.     Cervical back: Normal range of motion and neck supple.     Comments: Range of motion exam to the left shoulder limited due to pain, range of motion observed to 90 degrees.  Generalized tenderness to palpation to left upper back and diffusely to the left shoulder.  No bone deformity palpable and no point tenderness  Skin:    General: Skin is warm and dry.     Findings: No erythema or rash.  Neurological:     Mental Status: She is alert and oriented to person, place, and time.     Motor: No weakness.     Gait: Gait normal.     Comments: Left upper extremity neurovascularly intact  Psychiatric:        Mood and Affect: Mood normal.        Thought Content: Thought content normal.        Judgment: Judgment normal.      UC Treatments / Results  Labs (all labs ordered are listed, but only abnormal results are displayed) Labs Reviewed - No data to display  EKG   Radiology No results found.  Procedures Procedures (including critical care time)  Medications Ordered in UC Medications  dexamethasone (DECADRON) injection 10 mg (10 mg Intramuscular Given 02/10/24 0915)    Initial Impression / Assessment and Plan / UC Course  I have reviewed the triage vital signs and the nursing notes.  Pertinent labs & imaging results that were available during my care of the patient were reviewed by me and considered in my medical decision making (see chart for  details).     Suspect shoulder strain, treat with IM Decadron, Zanaflex, heat, massage, stretches.  Return for worsening symptoms.  Final Clinical Impressions(s) / UC Diagnoses   Final diagnoses:  Strain of left shoulder, initial encounter     Discharge Instructions      We have given you a steroid shot today to help with pain and inflammation.  I have prescribed a muscle relaxer additionally and you may continue using topical creams, muscle rubs, lidocaine patches, heat, massage, gentle range of motion exercises.    ED Prescriptions     Medication Sig Dispense Auth. Provider   tiZANidine (ZANAFLEX) 4 MG capsule Take 1 capsule (4 mg total) by mouth 3 (three) times daily as needed for muscle spasms. Do not drink alcohol or drive while taking this medication.  May cause drowsiness. 15 capsule Particia Nearing, [place]      PDMP not reviewed this encounter.   Roosvelt Maser [place], [place] 02/10/24 [phone]

## 2024-02-10 NOTE — Discharge Instructions (Signed)
 We have given you a steroid shot today to help with pain and inflammation.  I have prescribed a muscle relaxer additionally and you may continue using topical creams, muscle rubs, lidocaine patches, heat, massage, gentle range of motion exercises.

## 2024-02-10 NOTE — ED Triage Notes (Signed)
 Pt reports left sided neck pain/left arm soreness and decreased ROM since last night. Has tried voltaren gel last night and lidocaine patch noted in triage. Denies any known injury. Reports constant tingling since pain and soreness started.

## 2024-02-13 ENCOUNTER — Other Ambulatory Visit: Payer: Self-pay

## 2024-02-13 ENCOUNTER — Other Ambulatory Visit (HOSPITAL_COMMUNITY): Payer: Self-pay

## 2024-02-13 ENCOUNTER — Ambulatory Visit (INDEPENDENT_AMBULATORY_CARE_PROVIDER_SITE_OTHER)

## 2024-02-13 ENCOUNTER — Ambulatory Visit
Admission: EM | Admit: 2024-02-13 | Discharge: 2024-02-13 | Disposition: A | Discharge status: Home / Self Care | Attending: Nurse Practitioner | Admitting: Nurse Practitioner

## 2024-02-13 DIAGNOSIS — M25512 Pain in left shoulder: Secondary | ICD-10-CM

## 2024-02-13 MED ORDER — FLUOXETINE HCL 20 MG PO CAPS
20.0000 mg | ORAL_CAPSULE | Freq: Every day | ORAL | 2 refills | Status: AC
  Filled 2024-02-13: qty 90, 90d supply, fill #0

## 2024-02-13 MED ORDER — DICLOFENAC SODIUM 25 MG PO TBEC
25.0000 mg | DELAYED_RELEASE_TABLET | Freq: Two times a day (BID) | ORAL | 0 refills | Status: AC
  Filled 2024-02-13: qty 28, 14d supply, fill #0

## 2024-02-13 MED ORDER — KETOROLAC TROMETHAMINE 30 MG/ML IJ SOLN
30.0000 mg | Freq: Once | INTRAMUSCULAR | Status: AC
  Administered 2024-02-13: 30 mg via INTRAMUSCULAR

## 2024-02-13 NOTE — ED Triage Notes (Signed)
 Pt reports left arm and shoulder pain states the pain feels worse. Pt was seen on 02/10/2024 for strain of the left shoulder muscle. Given steroid injection in clinic and muscle relaxer rx.

## 2024-02-13 NOTE — Discharge Instructions (Signed)
 We gave you an injection of a medicine to help with pain and inflammation today called ketorolac or Toradol.  Starting tomorrow, take the diclofenac pill up to twice daily as needed with food to help with pain and inflammation in your shoulder.  Recommend close follow-up with your primary care provider and/or orthopedist if symptoms do not improve with treatment.

## 2024-02-13 NOTE — ED Provider Notes (Signed)
 [facility]    CSN: [id number] Arrival date & time: 02/13/24  0805      History   Chief Complaint No chief complaint on file.   HPI Sarah Booth is a 63 y.o. female.   Patient presents today for ongoing left shoulder pain.  She was seen for same 3 days ago, was treated with IM Decadron 10 mg and was relaxant medicine without much improvement.  She reports it is now very difficult to move her left shoulder at all and is having pain in her upper left back, anterior shoulder, posterior shoulder, and down into her upper arm.  She occasionally has numbness and tingling in her hand, however no significant weakness or swelling of the left upper extremity.  No decreased sensation of left upper extremity.  Denies history of any injuries or shoulder surgeries on that side.  No recent fall or trauma involving the left shoulder.  Reports symptoms began after returning from the beach.    Past Medical History:  Diagnosis Date   Allergy    per pt   Depression    Migraine    Migraine headache     Patient Active Problem List   Diagnosis Date Noted   Hypokalemia 09/14/2019   Diarrhea due to COVID-19 09/14/2019   Acute hypoxemic respiratory failure (HCC) 09/13/2019   Pneumonia due to COVID-19 virus 09/13/2019    Past Surgical History:  Procedure Laterality Date   BREAST BIOPSY     COLOSTOMY  2010   STOMACH SURGERY  2008   Tumor    OB History   No obstetric history on file.      Home Medications    Prior to Admission medications   Medication Sig Start Date End Date Taking? Authorizing Provider  diclofenac (VOLTAREN) 25 MG EC tablet Take 1 tablet (25 mg total) by mouth 2 (two) times daily for 14 days. Take with food 02/13/24 02/27/24 Yes Cathlean Marseilles A, NP  albuterol (PROVENTIL) (2.5 MG/3ML) 0.083% nebulizer solution Take 3 mLs (2.5 mg dose) by nebulization every 4 (four) hours as needed for Wheezing. 09/10/19   [provider]  ALPRAZolam Prudy Feeler) 0.25  MG tablet Take 0.25 mg by mouth 3 (three) times daily as needed. 05/21/20   [provider]  ALPRAZolam Prudy Feeler) 0.25 MG tablet Take 1 tablet (0.25 mg total) by mouth 3 (three) times daily as needed for sleep. 05/14/23     buPROPion (WELLBUTRIN XL) 150 MG 24 hr tablet Take 150 mg by mouth every morning.  05/22/19   [provider]  buPROPion (WELLBUTRIN XL) 150 MG 24 hr tablet Take 1 tablet (150 mg total) by mouth in the morning. 12/27/23     buPROPion (WELLBUTRIN XL) 300 MG 24 hr tablet Take 1 tablet (300 mg total) by mouth in the morning. 04/17/23   Eartha Inch, MD  chlorhexidine (PERIDEX) 0.12 % solution Use as directed 15 mLs in the mouth or throat 2 (two) times daily.  09/02/19   [provider]  Cholecalciferol 1.25 MG (50000 UT) capsule Take 1 capsule (50,000 Units total) by mouth once a week. 06/28/23     Cholecalciferol 1.25 MG (50000 UT) TABS Take by mouth.    [provider]  Cyanocobalamin 500 MCG SUBL Place under the tongue.    [provider]  diclofenac Sodium (VOLTAREN) 1 % GEL Apply 2 g topically 4 (four) times daily.    [provider]  FLUoxetine (PROZAC) 20 MG capsule Take 1 capsule (  20 mg total) by mouth daily. 08/24/23     FLUoxetine (PROZAC) 20 MG capsule Take 1 capsule (20 mg total) by mouth daily. 11/02/23     FLUoxetine (PROZAC) 20 MG capsule Take 1 capsule (20 mg total) by mouth daily. 01/17/24     FLUoxetine (PROZAC) 40 MG capsule Take 1 capsule (40 mg total) by mouth daily. 09/05/23     losartan (COZAAR) 25 MG tablet Take 1 tablet (25 mg total) by mouth daily. 05/14/23     losartan (COZAAR) 25 MG tablet Take 1 tablet (25 mg total) by mouth daily. 04/18/23   Elder Negus, NP  losartan (COZAAR) 25 MG tablet Take 2 tablets (50 mg total) by mouth daily. 08/10/23     losartan (COZAAR) 25 MG tablet Take 3 tablets (75 mg total) by mouth daily. 09/05/23     Multiple Vitamin (MULTIVITAMIN) tablet Take by mouth.    [provider]  pravastatin (PRAVACHOL) 40 MG tablet Take 1 tablet (40 mg total) by mouth every evening. 08/26/23     pravastatin (PRAVACHOL) 40 MG tablet Take 1 tablet (40 mg total) by mouth every evening. 09/07/23     SUMAtriptan (IMITREX) 100 MG tablet Take 100 mg by mouth every 2 (two) hours as needed for migraine or headache.  07/04/19   [provider]  SUMAtriptan (IMITREX) 100 MG tablet Take 1 tablet (100 mg total) by mouth daily as needed. 04/02/23   Eartha Inch, MD  tiZANidine (ZANAFLEX) 4 MG capsule Take 1 capsule (4 mg total) by mouth 3 (three) times daily as needed for muscle spasms. Do not drink alcohol or drive while taking this medication.  May cause drowsiness. 02/10/24   Particia Nearing, PA-C  VITAMIN E PO Take by mouth.    [provider]    Family History Family History  Problem Relation Age of Onset   Diabetes Sister    Diabetes Sister    Diabetes Sister    Diabetes Sister    Diabetes Sister    Diabetes Sister    Colon cancer Neg Hx    Colon polyps Neg Hx    Esophageal cancer Neg Hx    Rectal cancer Neg Hx    Stomach cancer Neg Hx     Social History Social History   Tobacco Use   Smoking status: Never   Smokeless tobacco: Never  Vaping Use   Vaping status: Never Used  Substance Use Topics   Alcohol use: No    Alcohol/week: 0.0 standard drinks of alcohol   Drug use: No     Allergies   Patient has no known allergies.   Review of Systems Review of Systems Per HPI  Physical Exam Triage Vital Signs ED Triage Vitals  Encounter Vitals Group     BP 02/13/24 0827 (!) 156/85     Systolic BP Percentile --      Diastolic BP Percentile --      Pulse Rate 02/13/24 0827 79     Resp 02/13/24 0827 18     Temp 02/13/24 0827 99.1 F (37.3 C)     Temp Source 02/13/24 0827 Oral     SpO2 02/13/24 0827 97 %     Weight --      Height --      Head Circumference --      Peak Flow --      Pain Score 02/13/24 0831 7     Pain Loc --  Pain Education --      Exclude from Growth Chart --    No data found.  Updated Vital Signs BP (!) 156/85 (BP Location: Right Arm)   Pulse 79   Temp 99.1 F (37.3 C) (Oral)   Resp 18   LMP 05/07/2015 (Approximate)   SpO2 97%   Visual Acuity Right Eye Distance:   Left Eye Distance:   Bilateral Distance:    Right Eye Near:   Left Eye Near:    Bilateral Near:     Physical Exam Vitals and nursing note reviewed.  Constitutional:      General: She is not in acute distress.    Appearance: Normal appearance. She is not toxic-appearing.  HENT:     Mouth/Throat:     Mouth: Mucous membranes are moist.     Pharynx: Oropharynx is clear.  Pulmonary:     Effort: Pulmonary effort is normal. No respiratory distress.  Musculoskeletal:     Comments: Inspection: no swelling, bruising, obvious deformity or redness left upper extremity Palpation: tender to palpation left trapezius, deltoid diffusely; no obvious deformities palpated ROM: Difficult to assess secondary to pain Strength: 5/5 bilateral grip strength Neurovascular: neurovascularly intact in distal bilateral upper extremities  Skin:    General: Skin is warm and dry.     Capillary Refill: Capillary refill takes less than 2 seconds.     Coloration: Skin is not jaundiced or pale.     Findings: No erythema.  Neurological:     Mental Status: She is alert and oriented to person, place, and time.  Psychiatric:        Behavior: Behavior is cooperative.      UC Treatments / Results  Labs (all labs ordered are listed, but only abnormal results are displayed) Labs Reviewed - No data to display  EKG   Radiology DG Shoulder Left Result Date: 02/13/2024 CLINICAL DATA:  Left shoulder pain EXAM: LEFT SHOULDER - 2+ VIEW COMPARISON:  None Available. FINDINGS: Degenerative changes in the left shoulder. Subacromial spurring noted and spurring along the greater tuberosity. No acute bony abnormality. Specifically, no fracture,  subluxation, or dislocation. IMPRESSION: Mild degenerative changes.  No acute bony abnormality. Electronically Signed   By: Charlett Nose M.D.   On: 02/13/2024 10:37    Procedures Procedures (including critical care time)  Medications Ordered in UC Medications  ketorolac (TORADOL) 30 MG/ML injection 30 mg (30 mg Intramuscular Given 02/13/24 0930)    Initial Impression / Assessment and Plan / UC Course  I have reviewed the triage vital signs and the nursing notes.  Pertinent labs & imaging results that were available during my care of the patient were reviewed by me and considered in my medical decision making (see chart for details).   Patient is well-appearing, afebrile, not tachycardic, not tachypneic, oxygenating well on room air.  Patient is mildly hypertensive in triage today.  1. Acute pain of left shoulder No red flags; vitals are stable; suspect musculoskeletal cause X-ray imaging obtained per patient request and pending at time of discharge Per my independent review, there appears to be arthritic changes of the acromion Toradol 30 mg IM was given in urgent care today, patient reported slight improvement in pain prior to discharge Will start diclofenac 25 mg up to twice daily with food as needed for pain Recommended follow-up with primary care provider/orthopedic provider if symptoms do not improve with treatment or if they worsen again after full course of diclofenac  Update: X-ray imaging shows mild  degenerative changes; no acute bony abnormality.  I called patient and updated patient with results.  No change to treatment plan at this time.  The patient was given the opportunity to ask questions.  All questions answered to their satisfaction.  The patient is in agreement to this plan.   Final Clinical Impressions(s) / UC Diagnoses   Final diagnoses:  Acute pain of left shoulder     Discharge Instructions      We gave you an injection of a medicine to help with pain and  inflammation today called ketorolac or Toradol.  Starting tomorrow, take the diclofenac pill up to twice daily as needed with food to help with pain and inflammation in your shoulder.  Recommend close follow-up with your primary care provider and/or orthopedist if symptoms do not improve with treatment.   ED Prescriptions     Medication Sig Dispense Auth. Provider   diclofenac (VOLTAREN) 25 MG EC tablet Take 1 tablet (25 mg total) by mouth 2 (two) times daily for 14 days. Take with food 28 tablet Valentino Nose, NP      PDMP not reviewed this encounter.   Valentino Nose, NP 02/13/24 1054

## 2024-02-14 ENCOUNTER — Other Ambulatory Visit: Payer: Self-pay

## 2024-02-18 ENCOUNTER — Other Ambulatory Visit: Payer: Self-pay

## 2024-02-19 ENCOUNTER — Other Ambulatory Visit: Payer: Self-pay

## 2024-02-19 MED ORDER — BUPROPION HCL ER (XL) 150 MG PO TB24
150.0000 mg | ORAL_TABLET | Freq: Every morning | ORAL | 1 refills | Status: DC
  Filled 2024-02-26 – 2024-04-03 (×3): qty 90, 90d supply, fill #0
  Filled 2024-07-21: qty 90, 90d supply, fill #1

## 2024-02-26 ENCOUNTER — Other Ambulatory Visit: Payer: Self-pay

## 2024-03-06 ENCOUNTER — Other Ambulatory Visit: Payer: Self-pay

## 2024-03-11 ENCOUNTER — Other Ambulatory Visit: Payer: Self-pay

## 2024-03-12 ENCOUNTER — Other Ambulatory Visit: Payer: Self-pay

## 2024-03-12 MED ORDER — LOSARTAN POTASSIUM 25 MG PO TABS
75.0000 mg | ORAL_TABLET | Freq: Every day | ORAL | 1 refills | Status: DC
  Filled 2024-03-12: qty 270, 90d supply, fill #0
  Filled 2024-06-20: qty 270, 90d supply, fill #1

## 2024-03-13 ENCOUNTER — Other Ambulatory Visit (HOSPITAL_COMMUNITY): Payer: Self-pay

## 2024-04-02 ENCOUNTER — Other Ambulatory Visit: Payer: Self-pay

## 2024-04-03 ENCOUNTER — Other Ambulatory Visit: Payer: Self-pay

## 2024-05-19 ENCOUNTER — Other Ambulatory Visit: Payer: Self-pay

## 2024-05-19 MED ORDER — FLUOXETINE HCL 20 MG PO CAPS
20.0000 mg | ORAL_CAPSULE | Freq: Every day | ORAL | 2 refills | Status: AC
  Filled 2024-05-19: qty 30, 30d supply, fill #0

## 2024-05-19 MED ORDER — ALPRAZOLAM 0.25 MG PO TABS
0.2500 mg | ORAL_TABLET | Freq: Three times a day (TID) | ORAL | 0 refills | Status: AC | PRN
  Filled 2024-05-19: qty 60, 20d supply, fill #0

## 2024-05-19 MED ORDER — DICLOFENAC SODIUM 1 % EX GEL
4.0000 g | Freq: Four times a day (QID) | CUTANEOUS | 1 refills | Status: AC | PRN
  Filled 2024-05-19: qty 100, 6d supply, fill #0

## 2024-05-26 ENCOUNTER — Other Ambulatory Visit: Payer: Self-pay

## 2024-06-05 ENCOUNTER — Ambulatory Visit
Admission: EM | Admit: 2024-06-05 | Discharge: 2024-06-05 | Disposition: A | Discharge status: Home / Self Care | Attending: Family Medicine | Admitting: Family Medicine

## 2024-06-05 DIAGNOSIS — J069 Acute upper respiratory infection, unspecified: Secondary | ICD-10-CM | POA: Diagnosis not present

## 2024-06-05 LAB — POC SOFIA SARS ANTIGEN FIA: SARS Coronavirus 2 Ag: NEGATIVE

## 2024-06-05 LAB — POCT RAPID STREP A (OFFICE): Rapid Strep A Screen: NEGATIVE

## 2024-06-05 MED ORDER — PROMETHAZINE-DM 6.25-15 MG/5ML PO SYRP: 5.0000 mL | ORAL_SOLUTION | Freq: Four times a day (QID) | ORAL | 0 refills | Status: AC | PRN

## 2024-06-05 MED ORDER — LIDOCAINE VISCOUS HCL 2 % MT SOLN: 10.0000 mL | OROMUCOSAL | 0 refills | Status: AC | PRN

## 2024-06-05 MED ORDER — AZELASTINE HCL 0.1 % NA SOLN: 1.0000 | Freq: Two times a day (BID) | NASAL | 0 refills | Status: AC

## 2024-06-05 NOTE — ED Triage Notes (Signed)
 Pt reports cough, headache, sore throat, nasal congestion x 1 day, pt states she has taken home COVID test that was negative but it was expired pt is requesting a COVID test.

## 2024-06-05 NOTE — ED Provider Notes (Signed)
 [facility]    CSN: [id number] Arrival date & time: 06/05/24  0856      History   Chief Complaint No chief complaint on file.   HPI Sarah Booth is a 63 y.o. female.   Presenting today with 2-day history of nasal congestion, sore throat, cough, headache.  Denies fever, chills, body aches, chest pain, shortness of breath, abdominal pain, vomiting, diarrhea.  So far trying some cough syrup with minimal relief.  Home COVID test was negative but noted that it was expired so she wanted to come here and get 1.    Past Medical History:  Diagnosis Date   Allergy    per pt   Depression    Migraine    Migraine headache     Patient Active Problem List   Diagnosis Date Noted   Hypokalemia 09/14/2019   Diarrhea due to COVID-19 09/14/2019   Acute hypoxemic respiratory failure (HCC) 09/13/2019   Pneumonia due to COVID-19 virus 09/13/2019    Past Surgical History:  Procedure Laterality Date   BREAST BIOPSY     COLOSTOMY  2010   STOMACH SURGERY  2008   Tumor    OB History   No obstetric history on file.      Home Medications    Prior to Admission medications   Medication Sig Start Date End Date Taking? Authorizing Provider  azelastine  (ASTELIN ) 0.1 % nasal spray Place 1 spray into both nostrils 2 (two) times daily. Use in each nostril as directed 06/05/24  Yes Stuart Vernell Norris, PA-C  lidocaine  (XYLOCAINE ) 2 % solution Use as directed 10 mLs in the mouth or throat every 3 (three) hours as needed. 06/05/24  Yes Stuart Vernell Norris, PA-C  promethazine -dextromethorphan  (PROMETHAZINE -DM) 6.25-15 MG/5ML syrup Take 5 mLs by mouth 4 (four) times daily as needed. 06/05/24  Yes Stuart Vernell Norris, PA-C  albuterol  (PROVENTIL ) (2.5 MG/3ML) 0.083% nebulizer solution Take 3 mLs (2.5 mg dose) by nebulization every 4 (four) hours as needed for Wheezing. 09/10/19   [provider]  ALPRAZolam  (XANAX ) 0.25 MG tablet Take 0.25 mg by mouth 3 (three) times  daily as needed. 05/21/20   [provider]  ALPRAZolam  (XANAX ) 0.25 MG tablet Take 1 tablet (0.25 mg total) by mouth 3 (three) times daily as needed for sleep. 05/14/23     ALPRAZolam  (XANAX ) 0.25 MG tablet Take 1 tablet (0.25 mg total) by mouth 3 (three) times daily as needed FOR SLEEP. 05/19/24     buPROPion  (WELLBUTRIN  XL) 150 MG 24 hr tablet Take 150 mg by mouth every morning.  05/22/19   [provider]  buPROPion  (WELLBUTRIN  XL) 150 MG 24 hr tablet Take 1 tablet (150 mg total) by mouth in the morning. 02/19/24     buPROPion  (WELLBUTRIN  XL) 300 MG 24 hr tablet Take 1 tablet (300 mg total) by mouth in the morning. 04/17/23   Sophronia Ozell BROCKS, MD  chlorhexidine (PERIDEX) 0.12 % solution Use as directed 15 mLs in the mouth or throat 2 (two) times daily.  09/02/19   [provider]  Cholecalciferol  1.25 MG (50000 UT) capsule Take 1 capsule (50,000 Units total) by mouth once a week. 06/28/23     Cholecalciferol  1.25 MG (50000 UT) TABS Take by mouth.    [provider]  Cyanocobalamin 500 MCG SUBL Place under the tongue.    [provider]  diclofenac  Sodium (VOLTAREN ) 1 % GEL Apply 2 g topically 4 (four) times daily.    [provider]  diclofenac  Sodium (VOLTAREN ) 1 % GEL Apply 4 grams topically 4 (four) times daily as needed. 05/19/24     FLUoxetine  (PROZAC ) 20 MG capsule Take 1 capsule (20 mg total) by mouth daily. 08/24/23     FLUoxetine  (PROZAC ) 20 MG capsule Take 1 capsule (20 mg total) by mouth daily. 01/17/24     FLUoxetine  (PROZAC ) 20 MG capsule Take 1 capsule (20 mg total) by mouth daily. 02/13/24     FLUoxetine  (PROZAC ) 20 MG capsule Take 1 capsule (20 mg total) by mouth daily. 05/19/24     losartan  (COZAAR ) 25 MG tablet Take 1 tablet (25 mg total) by mouth daily. 05/14/23     losartan  (COZAAR ) 25 MG tablet Take 1 tablet (25 mg total) by mouth daily. 04/18/23   McClanahan, Kyra, NP  losartan  (COZAAR ) 25 MG tablet Take 2 tablets (50 mg total) by  mouth daily. 08/10/23     losartan  (COZAAR ) 25 MG tablet Take 3 tablets (75 mg total) by mouth daily. 03/12/24     Multiple Vitamin (MULTIVITAMIN) tablet Take by mouth.    [provider]  pravastatin  (PRAVACHOL ) 40 MG tablet Take 1 tablet (40 mg total) by mouth every evening. 08/26/23     pravastatin  (PRAVACHOL ) 40 MG tablet Take 1 tablet (40 mg total) by mouth every evening. 09/07/23     SUMAtriptan  (IMITREX ) 100 MG tablet Take 100 mg by mouth every 2 (two) hours as needed for migraine or headache.  07/04/19   [provider]  SUMAtriptan  (IMITREX ) 100 MG tablet Take 1 tablet (100 mg total) by mouth daily as needed. 04/02/23   Sophronia Ozell BROCKS, MD  tiZANidine  (ZANAFLEX ) 4 MG capsule Take 1 capsule (4 mg total) by mouth 3 (three) times daily as needed for muscle spasms. Do not drink alcohol or drive while taking this medication.  May cause drowsiness. 02/10/24   Stuart Vernell Norris, PA-C  VITAMIN E PO Take by mouth.    [provider]    Family History Family History  Problem Relation Age of Onset   Diabetes Sister    Diabetes Sister    Diabetes Sister    Diabetes Sister    Diabetes Sister    Diabetes Sister    Colon cancer Neg Hx    Colon polyps Neg Hx    Esophageal cancer Neg Hx    Rectal cancer Neg Hx    Stomach cancer Neg Hx     Social History Social History   Tobacco Use   Smoking status: Never   Smokeless tobacco: Never  Vaping Use   Vaping status: Never Used  Substance Use Topics   Alcohol use: No    Alcohol/week: 0.0 standard drinks of alcohol   Drug use: No     Allergies   Patient has no known allergies.   Review of Systems Review of Systems Per HPI  Physical Exam Triage Vital Signs ED Triage Vitals  Encounter Vitals Group     BP 06/05/24 0958 126/81     Girls Systolic BP Percentile --      Girls Diastolic BP Percentile --      Boys Systolic BP Percentile --      Boys Diastolic BP Percentile --      Pulse Rate 06/05/24 0958  81     Resp 06/05/24 0958 18     Temp 06/05/24 0958 98.7 F (37.1 C)     Temp Source 06/05/24 0958 Oral     SpO2 06/05/24 0958 98 %  Weight --      Height --      Head Circumference --      Peak Flow --      Pain Score 06/05/24 0959 0     Pain Loc --      Pain Education --      Exclude from Growth Chart --    No data found.  Updated Vital Signs BP 126/81 (BP Location: Right Arm)   Pulse 81   Temp 98.7 F (37.1 C) (Oral)   Resp 18   LMP 05/07/2015 (Approximate)   SpO2 98%   Visual Acuity Right Eye Distance:   Left Eye Distance:   Bilateral Distance:    Right Eye Near:   Left Eye Near:    Bilateral Near:     Physical Exam Vitals and nursing note reviewed.  Constitutional:      Appearance: Normal appearance.  HENT:     Head: Atraumatic.     Right Ear: Tympanic membrane and external ear normal.     Left Ear: Tympanic membrane and external ear normal.     Nose: Rhinorrhea present.     Mouth/Throat:     Mouth: Mucous membranes are moist.     Pharynx: Posterior oropharyngeal erythema present.  Eyes:     Extraocular Movements: Extraocular movements intact.     Conjunctiva/sclera: Conjunctivae normal.  Cardiovascular:     Rate and Rhythm: Normal rate and regular rhythm.     Heart sounds: Normal heart sounds.  Pulmonary:     Effort: Pulmonary effort is normal.     Breath sounds: Normal breath sounds. No wheezing or rales.  Musculoskeletal:        General: Normal range of motion.     Cervical back: Normal range of motion and neck supple.  Skin:    General: Skin is warm and dry.  Neurological:     Mental Status: She is alert and oriented to person, place, and time.  Psychiatric:        Mood and Affect: Mood normal.        Thought Content: Thought content normal.     UC Treatments / Results  Labs (all labs ordered are listed, but only abnormal results are displayed) Labs Reviewed  POC SOFIA SARS ANTIGEN FIA  POCT RAPID STREP A (OFFICE)     EKG   Radiology No results found.  Procedures Procedures (including critical care time)  Medications Ordered in UC Medications - No data to display  Initial Impression / Assessment and Plan / UC Course  I have reviewed the triage vital signs and the nursing notes.  Pertinent labs & imaging results that were available during my care of the patient were reviewed by me and considered in my medical decision making (see chart for details).     COVID and strep testing negative in clinic, suspect viral respiratory infection.  Treat with Astelin , viscous lidocaine , Phenergan  DM, supportive over-the-counter medications and home care.  Return for worsening symptoms.  Final Clinical Impressions(s) / UC Diagnoses   Final diagnoses:  Viral URI with cough   Discharge Instructions   None    ED Prescriptions     Medication Sig Dispense Auth. Provider   azelastine  (ASTELIN ) 0.1 % nasal spray Place 1 spray into both nostrils 2 (two) times daily. Use in each nostril as directed 30 mL Stuart Vernell Norris, PA-C   lidocaine  (XYLOCAINE ) 2 % solution Use as directed 10 mLs in the mouth or throat every 3 (three)  hours as needed. 100 mL Stuart Vernell Norris, PA-C   promethazine -dextromethorphan  (PROMETHAZINE -DM) 6.25-15 MG/5ML syrup Take 5 mLs by mouth 4 (four) times daily as needed. 100 mL Stuart Vernell Norris, [place]      PDMP not reviewed this encounter.   Stuart Vernell Norris, [place] 06/05/24 [phone]

## 2024-06-20 ENCOUNTER — Other Ambulatory Visit: Payer: Self-pay

## 2024-07-14 ENCOUNTER — Other Ambulatory Visit: Payer: Self-pay

## 2024-07-14 MED ORDER — DICLOFENAC SODIUM 1 % EX GEL
CUTANEOUS | 1 refills | Status: AC
  Filled 2024-07-14: qty 100, 13d supply, fill #0
  Filled 2024-09-16: qty 100, 13d supply, fill #1

## 2024-07-18 ENCOUNTER — Other Ambulatory Visit: Payer: Self-pay

## 2024-07-21 ENCOUNTER — Other Ambulatory Visit: Payer: Self-pay

## 2024-07-22 ENCOUNTER — Other Ambulatory Visit (HOSPITAL_COMMUNITY): Payer: Self-pay

## 2024-07-23 ENCOUNTER — Other Ambulatory Visit: Payer: Self-pay

## 2024-08-06 ENCOUNTER — Other Ambulatory Visit: Payer: Self-pay

## 2024-08-06 MED ORDER — DICLOFENAC SODIUM 1 % EX GEL
CUTANEOUS | 1 refills | Status: AC
  Filled 2024-08-06: qty 100, 30d supply, fill #0

## 2024-08-15 ENCOUNTER — Other Ambulatory Visit: Payer: Self-pay

## 2024-08-19 ENCOUNTER — Other Ambulatory Visit: Payer: Self-pay

## 2024-08-19 ENCOUNTER — Other Ambulatory Visit (HOSPITAL_COMMUNITY): Payer: Self-pay

## 2024-08-20 ENCOUNTER — Other Ambulatory Visit: Payer: Self-pay

## 2024-08-20 MED ORDER — AMOXICILLIN 500 MG PO CAPS
500.0000 mg | ORAL_CAPSULE | Freq: Four times a day (QID) | ORAL | 0 refills | Status: DC
  Filled 2024-08-20: qty 40, 10d supply, fill #0

## 2024-08-21 ENCOUNTER — Other Ambulatory Visit: Payer: Self-pay

## 2024-09-15 ENCOUNTER — Other Ambulatory Visit: Payer: Self-pay

## 2024-09-15 MED ORDER — AMOXICILLIN 500 MG PO CAPS
500.0000 mg | ORAL_CAPSULE | Freq: Four times a day (QID) | ORAL | 0 refills | Status: AC
  Filled 2024-09-15: qty 40, 10d supply, fill #0

## 2024-09-16 ENCOUNTER — Other Ambulatory Visit: Payer: Self-pay

## 2024-09-17 ENCOUNTER — Other Ambulatory Visit: Payer: Self-pay

## 2024-09-17 MED ORDER — LOSARTAN POTASSIUM 25 MG PO TABS
75.0000 mg | ORAL_TABLET | Freq: Every day | ORAL | 1 refills | Status: AC
  Filled 2024-09-17: qty 270, 90d supply, fill #0

## 2024-09-17 MED ORDER — FLUOXETINE HCL 20 MG PO CAPS
20.0000 mg | ORAL_CAPSULE | Freq: Every day | ORAL | 0 refills | Status: AC
  Filled 2024-09-17: qty 90, 90d supply, fill #0

## 2024-09-19 ENCOUNTER — Other Ambulatory Visit: Payer: Self-pay

## 2024-10-30 ENCOUNTER — Other Ambulatory Visit: Payer: Self-pay

## 2024-10-31 ENCOUNTER — Other Ambulatory Visit: Payer: Self-pay

## 2024-10-31 MED ORDER — BUPROPION HCL ER (XL) 150 MG PO TB24
150.0000 mg | ORAL_TABLET | Freq: Every morning | ORAL | 1 refills | Status: AC
  Filled 2024-10-31: qty 90, 90d supply, fill #0

## 2024-11-03 ENCOUNTER — Other Ambulatory Visit: Payer: Self-pay

## 2024-11-03 MED ORDER — ALPRAZOLAM 0.25 MG PO TABS
0.2500 mg | ORAL_TABLET | Freq: Three times a day (TID) | ORAL | 0 refills | Status: AC | PRN
  Filled 2024-11-03 – 2024-11-12 (×2): qty 60, 20d supply, fill #0

## 2024-11-03 MED ORDER — PRAVASTATIN SODIUM 40 MG PO TABS
40.0000 mg | ORAL_TABLET | Freq: Every evening | ORAL | 3 refills | Status: AC
  Filled 2024-11-03 – 2024-11-12 (×2): qty 90, 90d supply, fill #0

## 2024-11-11 ENCOUNTER — Other Ambulatory Visit: Payer: Self-pay

## 2024-11-12 ENCOUNTER — Other Ambulatory Visit: Payer: Self-pay

## 2024-11-12 ENCOUNTER — Telehealth: Payer: Self-pay | Admitting: Family

## 2024-11-12 ENCOUNTER — Ambulatory Visit: Admitting: Family

## 2024-11-12 ENCOUNTER — Other Ambulatory Visit (HOSPITAL_BASED_OUTPATIENT_CLINIC_OR_DEPARTMENT_OTHER): Payer: Self-pay

## 2024-11-12 DIAGNOSIS — M545 Low back pain, unspecified: Secondary | ICD-10-CM | POA: Diagnosis not present

## 2024-11-12 DIAGNOSIS — R202 Paresthesia of skin: Secondary | ICD-10-CM

## 2024-11-12 DIAGNOSIS — M25522 Pain in left elbow: Secondary | ICD-10-CM
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
## 2024-11-12 NOTE — Telephone Encounter (Signed)
 Pt called wanting to make sure you had the right Pharmacy to send her medication to. Pharmacy is [facility] on [address].

## 2024-11-13 ENCOUNTER — Other Ambulatory Visit: Payer: Self-pay

## 2024-11-13 NOTE — Telephone Encounter (Signed)
 Yes we have that pharmacy on file for her. FYI. If you were going to send in a medication for her?

## 2024-11-14 ENCOUNTER — Other Ambulatory Visit: Payer: Self-pay

## 2024-11-14 ENCOUNTER — Encounter: Payer: Self-pay | Admitting: Family

## 2024-11-14 ENCOUNTER — Telehealth: Payer: Self-pay | Admitting: Family

## 2024-11-14 MED ORDER — PREDNISONE 50 MG PO TABS
ORAL_TABLET | ORAL | 0 refills | Status: AC
  Filled 2024-11-14: qty 5, 5d supply, fill #0

## 2024-11-14 MED ORDER — METHOCARBAMOL 500 MG PO TABS
500.0000 mg | ORAL_TABLET | Freq: Four times a day (QID) | ORAL | 0 refills | Status: AC | PRN
  Filled 2024-11-14: qty 30, 8d supply, fill #0

## 2024-11-14 NOTE — Telephone Encounter (Signed)
 Patient called. She would like muscle relaxer and prednisone  called in for her.

## 2024-11-25 ENCOUNTER — Other Ambulatory Visit: Payer: Self-pay

## 2024-11-25 MED ORDER — PRAVASTATIN SODIUM 40 MG PO TABS: 40.0000 mg | ORAL_TABLET | Freq: Every evening | ORAL | 3 refills | Status: AC

## 2024-11-25 MED ORDER — LOSARTAN POTASSIUM 25 MG PO TABS: 75.0000 mg | ORAL_TABLET | Freq: Every day | ORAL | 1 refills | Status: AC

## 2024-11-26 ENCOUNTER — Other Ambulatory Visit: Payer: Self-pay

## 2024-11-26 MED ORDER — BUPROPION HCL ER (XL) 150 MG PO TB24: 150.0000 mg | ORAL_TABLET | Freq: Every morning | ORAL | 2 refills | Status: AC

## 2024-11-26 MED ORDER — FLUOXETINE HCL 20 MG PO CAPS: 20.0000 mg | ORAL_CAPSULE | Freq: Every day | ORAL | 2 refills | Status: AC

## 2024-11-27 ENCOUNTER — Other Ambulatory Visit: Payer: Self-pay

## 2024-11-27 ENCOUNTER — Telehealth: Payer: Self-pay | Admitting: Family

## 2024-11-27 DIAGNOSIS — M545 Low back pain, unspecified: Secondary | ICD-10-CM

## 2024-11-27 NOTE — Telephone Encounter (Signed)
 Patient called. Would like a referral for PT to come here.

## 2024-12-04 ENCOUNTER — Ambulatory Visit: Admitting: Rehabilitative and Restorative Service Providers"

## 2024-12-16 ENCOUNTER — Ambulatory Visit: Admitting: Physical Therapy

## 2024-12-16 ENCOUNTER — Ambulatory Visit: Admitting: Family

## 2024-12-16 ENCOUNTER — Ambulatory Visit: Admitting: Rehabilitative and Restorative Service Providers"
